# Patient Record
Sex: Male | Born: 1960 | Race: White | Hispanic: No | Smoking: Former smoker
Health system: Southern US, Community
[De-identification: ages and names within clinical notes are randomized; demographics above are authoritative.]

## PROBLEM LIST (undated history)

## (undated) DIAGNOSIS — I10 Essential (primary) hypertension: Secondary | ICD-10-CM

## (undated) DIAGNOSIS — G8929 Other chronic pain: Secondary | ICD-10-CM

## (undated) DIAGNOSIS — J15212 Pneumonia due to Methicillin resistant Staphylococcus aureus: Secondary | ICD-10-CM

## (undated) HISTORY — DX: Other chronic pain: G89.29

## (undated) HISTORY — PX: AV FISTULA PLACEMENT: SHX1204

## (undated) HISTORY — DX: Essential (primary) hypertension: I10

## (undated) HISTORY — PX: ORIF ANKLE FRACTURE BIMALLEOLAR: SUR920

## (undated) HISTORY — DX: Pneumonia due to methicillin resistant Staphylococcus aureus: J15.212

## (undated) HISTORY — PX: AV FISTULA REPAIR: SHX563

---

## 2010-07-07 ENCOUNTER — Ambulatory Visit: Payer: Self-pay | Admitting: Family Medicine

## 2010-07-07 ENCOUNTER — Encounter (INDEPENDENT_AMBULATORY_CARE_PROVIDER_SITE_OTHER): Payer: Self-pay | Admitting: *Deleted

## 2010-07-07 DIAGNOSIS — N186 End stage renal disease: Secondary | ICD-10-CM | POA: Insufficient documentation

## 2010-07-07 DIAGNOSIS — E11319 Type 2 diabetes mellitus with unspecified diabetic retinopathy without macular edema: Secondary | ICD-10-CM | POA: Insufficient documentation

## 2010-07-07 DIAGNOSIS — E118 Type 2 diabetes mellitus with unspecified complications: Secondary | ICD-10-CM

## 2010-07-07 DIAGNOSIS — M549 Dorsalgia, unspecified: Secondary | ICD-10-CM | POA: Insufficient documentation

## 2010-07-13 ENCOUNTER — Telehealth: Payer: Self-pay | Admitting: Family Medicine

## 2010-07-14 ENCOUNTER — Encounter: Payer: Self-pay | Admitting: Family Medicine

## 2010-07-14 ENCOUNTER — Telehealth: Payer: Self-pay | Admitting: Family Medicine

## 2010-07-16 ENCOUNTER — Encounter: Payer: Self-pay | Admitting: Family Medicine

## 2010-07-22 ENCOUNTER — Encounter: Payer: Self-pay | Admitting: Family Medicine

## 2010-07-30 ENCOUNTER — Ambulatory Visit: Payer: Self-pay | Admitting: Family Medicine

## 2010-08-04 ENCOUNTER — Encounter: Payer: Self-pay | Admitting: Family Medicine

## 2010-08-07 ENCOUNTER — Telehealth: Payer: Self-pay | Admitting: Family Medicine

## 2010-08-07 ENCOUNTER — Encounter: Payer: Self-pay | Admitting: Family Medicine

## 2010-08-11 ENCOUNTER — Telehealth: Payer: Self-pay | Admitting: Family Medicine

## 2010-09-01 ENCOUNTER — Ambulatory Visit: Payer: Self-pay | Admitting: Family Medicine

## 2010-09-04 ENCOUNTER — Telehealth: Payer: Self-pay | Admitting: Family Medicine

## 2010-09-07 ENCOUNTER — Telehealth: Payer: Self-pay | Admitting: Family Medicine

## 2010-09-07 ENCOUNTER — Encounter: Payer: Self-pay | Admitting: Family Medicine

## 2010-09-14 ENCOUNTER — Telehealth: Payer: Self-pay | Admitting: Family Medicine

## 2010-09-21 ENCOUNTER — Encounter: Payer: Self-pay | Admitting: Family Medicine

## 2010-10-08 ENCOUNTER — Encounter: Payer: Self-pay | Admitting: Family Medicine

## 2010-10-09 ENCOUNTER — Encounter: Payer: Self-pay | Admitting: Family Medicine

## 2010-10-13 ENCOUNTER — Ambulatory Visit: Payer: Self-pay | Admitting: Family Medicine

## 2010-10-14 ENCOUNTER — Encounter: Payer: Self-pay | Admitting: Family Medicine

## 2010-10-27 ENCOUNTER — Encounter: Payer: Self-pay | Admitting: Family Medicine

## 2010-11-16 ENCOUNTER — Encounter: Payer: Self-pay | Admitting: Family Medicine
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
## 2010-11-17 NOTE — Progress Notes (Signed)
  Phone Note Call from Patient   Caller: Patient Call For: Nani Gasser MD Summary of Call: pt wants refill of pain meds Initial call taken by: Avon Gully CMA, Duncan Dull),  September 14, 2010 4:43 PM    Prescriptions: MORPHINE SULFATE 15 MG TABS (MORPHINE SULFATE) one tablet by mouth two times a day  #60 x 0   Entered by:   Avon Gully CMA, (AAMA)   Authorized by:   Nani Gasser MD   Signed by:   Avon Gully CMA, (AAMA) on 09/14/2010   Method used:   Printed then faxed to ...       [address] [phone]* (retail)       [address] [place], [place]  [address]       Ph: [phone]       Fax: [phone]   RxID:   [id number]

## 2010-11-17 NOTE — Progress Notes (Signed)
Summary: PAIN REFERRAL  ---- Converted from flag ---- ---- 07/13/2010 2:31 PM, Michaelle Copas wrote: I spoke with [facility] at [facility] and she states that she will schedule this patient for a November appt... Thanks, Jen  ---- 07/13/2010 9:09 AM, Nani Gasser MD wrote: Can you find out when his appt is with Dr. Oneal Grout. I need to refill his pain meds but want to know how much to give him. ------------------------------

## 2010-11-17 NOTE — Progress Notes (Signed)
Summary: FYI  Phone Note From Other Clinic Call back at [phone]   Caller: Olegario Messier- [facility] Call For: [facility] Summary of Call: Order to evaluate for PT needs.  STates she did not pick him up for PT he is being seen for low vision program and states pt was not very co-operative with that. Initial call taken by: Kathlene November LPN,  September 04, 2010 8:14 AM

## 2010-11-17 NOTE — Progress Notes (Signed)
Summary: Med List Brought by Patient  Med List Brought by Patient   Imported By: Lanelle Bal 08/21/2010 12:27:55  _____________________________________________________________________  External Attachment:    Type:   Image     Comment:   External Document

## 2010-11-17 NOTE — Letter (Signed)
Summary: Medication Discharge Instructions/[facility]  Medication Discharge Instructions/[facility]   Imported By: Sherian Rein 08/18/2010 13:40:59  _____________________________________________________________________  External Attachment:    Type:   Image     Comment:   External Document

## 2010-11-17 NOTE — Letter (Signed)
Summary: [facility]   Imported By: Lanelle Bal 09/15/2010 12:48:04  _____________________________________________________________________  External Attachment:    Type:   Image     Comment:   External Document

## 2010-11-17 NOTE — Progress Notes (Signed)
Summary: Pain management  Phone Note Call from Patient   Caller: Sister- [place] Call For: [facility] Summary of Call: Pt sisiter calls and would like to speak with you in regards to her brother= states its kind of important. Please call her on cell at [phone] Initial call taken by: Kathlene November LPN,  September 07, 2010 12:41 PM  Follow-up for Phone Call        Let him know I will fill meds until pain managment has a change to get his records and see him back.  Follow-up by: Nani Gasser MD,  September 08, 2010 1:13 PM  Additional Follow-up for Phone Call Additional follow up Details #1::        left message on [place]'s vm with abv(sister) Additional Follow-up by: Avon Gully CMA, Duncan Dull),  September 08, 2010 4:30 PM

## 2010-11-17 NOTE — Progress Notes (Signed)
Summary: Rescheduled pain MD appt.  Phone Note Call from Patient Call back at Home Phone [phone]   Caller: Patient Call For: Nani Gasser MD Summary of Call: Pt had to change his pain MD appt. due to fistula appt. Will be seen on November22, 2011 Initial call taken by: Kathlene November LPN,  August 11, 2010 10:04 AM

## 2010-11-17 NOTE — Progress Notes (Signed)
  Phone Note Call from Patient   Caller: sister Call For: Nani Gasser MD Summary of Call: [place] pt's sister called to let you know that pt's morohine sulfate is due on tues and wants to pick it up monday Initial call taken by: Avon Gully CMA, Duncan Dull),  August 07, 2010 4:06 PM    Prescriptions: MORPHINE SULFATE 15 MG TABS (MORPHINE SULFATE) one tablet by mouth two times a day  #60 x 0   Entered by:   Avon Gully CMA, (AAMA)   Authorized by:   Nani Gasser MD   Signed by:   Avon Gully CMA, (AAMA) on 08/07/2010   Method used:   Print then Give to Patient   RxID:   [id number]

## 2010-11-17 NOTE — Progress Notes (Signed)
Summary: Morphine Rx  Phone Note Call from Patient Call back at [phone]   Caller: Sister- Ilona Sorrel Call For: Nani Gasser MD Summary of Call: Pt sister called and said she tore up his rx for the Morphine Sulfate that you gave him. Said she guesses it was with all receipts and when she tore them up it was in there with them- feels bad about it and wanted to know if you would give him another one Initial call taken by: Kathlene November,  July 13, 2010 8:50 AM  Follow-up for Phone Call        Will refill once.   Follow-up by: Nani Gasser MD,  July 13, 2010 2:25 PM  Additional Follow-up for Phone Call Additional follow up Details #1::        Pt notified Additional Follow-up by: Kathlene November,  July 13, 2010 2:35 PM    Prescriptions: MORPHINE SULFATE 15 MG TABS (MORPHINE SULFATE) one tablet by mouth two times a day  #60 x 0   Entered and Authorized by:   Nani Gasser MD   Signed by:   Nani Gasser MD on 07/13/2010   Method used:   Print then Give to Patient   RxID:   [id number]

## 2010-11-17 NOTE — Letter (Signed)
[facility]   Imported By: Lanelle Bal 08/13/2010 11:22:24  _____________________________________________________________________  External Attachment:    Type:   Image     Comment:   External Document
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
## 2010-11-17 NOTE — Assessment & Plan Note (Signed)
Summary: 1 mo f/u HTN, DM   Vital Signs:  Patient profile:   50 year old male Weight:      255 pounds Pulse rate:   90 / minute BP sitting:   83 / 44  (left arm) Cuff size:   regular  Vitals Entered By: Avon Gully CMA, Duncan Dull) (September 01, 2010 1:43 PM) CC: f/u BP   CC:  f/u BP.  History of Present Illness: BPs are still running low even after stopping several BP meds. His sister sasys he will often skip his atenolol the night before dialysis.    He is seeing pain managment in a couple of weeks for his hands.  Hx of nerve damage. Previously on neurontin but they felt his is was caused his acute renal failure per his sister. He feel his hands drawing up at times and they are painful.    Current Medications (verified): 1)  Atenolol 50 Mg Tabs (Atenolol) .... One-Half  Tablet By Mouth Two Times A Day 2)  Stool Softener 100 Mg Caps (Docusate Sodium) .... On E Tablet By Mouth Three Times A Day 3)  Bumetanide 2 Mg Tabs (Bumetanide) .... Two Tablets By Moutj  Two Times A Day 4)  Morphine Sulfate 15 Mg Tabs (Morphine Sulfate) .... One Tablet By Mouth Two Times A Day 5)  Promethazine Hcl 25 Mg Tabs (Promethazine Hcl) .... One Tablet By Mouth Every 4 To 6 Hours As Needed 6)  Omeprazole 40 Mg Cpdr (Omeprazole) .... Take One Tablet Daily 7)  Doxazosin Mesylate 4 Mg Tabs (Doxazosin Mesylate) .... Take One Tablet By Mouth Once A Day 8)  Tramadol Hcl 50 Mg Tabs (Tramadol Hcl) .... One Tablet By Mouth  Every  8 Hours As Needed For Pain 9)  Levemir 100 Unit/ml Soln (Insulin Detemir) .... 44 Units  At Night 10)  Novolin R 100 Unit/ml Soln (Insulin Regular Human) .Marland Kitchen.. 12 Units Before Meals 11)  Simvastatin 40 Mg Tabs (Simvastatin) .... Take 1 Tablet By Mouth Once A Day At Bedtime  Allergies (verified): No Known Drug Allergies  Comments:  Nurse/Medical Assistant: The patient's medications and allergies were reviewed with the patient and were updated in the Medication and Allergy  Lists. Avon Gully CMA, Duncan Dull) (September 01, 2010 1:43 PM)  Physical Exam  General:  Well-developed,well-nourished,in no acute distress; alert,appropriate and cooperative throughout examination Head:  Normocephalic and atraumatic without obvious abnormalities. No apparent alopecia or balding. Lungs:  Normal respiratory effort, chest expands symmetrically. Lungs are clear to auscultation, no crackles or wheezes. Heart:  Normal rate and regular rhythm. S1 and S2 normal without gallop, murmur, click, rub or other extra sounds. Extremities:  NO LE edema.  Skin:  Surgical scar on his right forearm, with steri-stips still in place.    Impression & Recommendations:  Problem # 1:  HYPERTENSION, BENIGN (ICD-401.1) Stop the atenolol and ded the bumetanide to one two times a day  F/U in 6-8 weeks.   The following medications were removed from the medication list:    Atenolol 50 Mg Tabs (Atenolol) ..... One-half  tablet by mouth two times a day His updated medication list for this problem includes:    Bumetanide 2 Mg Tabs (Bumetanide) .Marland Kitchen... Take 1 tablet by mouth two times a day    Doxazosin Mesylate 4 Mg Tabs (Doxazosin mesylate) .Marland Kitchen... Take one tablet by mouth once a day  Problem # 2:  DIABETES MELLITUS (ICD-250.00) Given samples todya. He is having a hard time affording his insulin meds.  His updated medication list for this problem includes:    Levemir 100 Unit/ml Soln (Insulin detemir) .Marland KitchenMarland KitchenMarland KitchenMarland Kitchen 44 units [place] at night    Novolin R 100 Unit/ml Soln (Insulin regular human) .Marland Kitchen... 12 units before meals  Problem # 3:  BACK PAIN, CHRONIC (ICD-724.5) I did give him small quanitity of oxycodone until he sees pain managment.   His updated medication list for this problem includes:    Morphine Sulfate 15 Mg Tabs (Morphine sulfate) ..... One tablet by mouth two times a day    Tramadol Hcl 50 Mg Tabs (Tramadol hcl) ..... One tablet by mouth  every  8 hours as needed for pain    Oxycodone-acetaminophen  5-325 Mg Tabs (Oxycodone-acetaminophen) .Marland Kitchen... Take 1 tablet by mouth three times a day as needed for severe pain.  Complete Medication List: 1)  Stool Softener 100 Mg Caps (Docusate sodium) .... On e tablet by mouth three times a day 2)  Bumetanide 2 Mg Tabs (Bumetanide) .... Take 1 tablet by mouth two times a day 3)  Morphine Sulfate 15 Mg Tabs (Morphine sulfate) .... One tablet by mouth two times a day 4)  Promethazine Hcl 25 Mg Tabs (Promethazine hcl) .... One tablet by mouth every 4 to 6 hours as needed 5)  Omeprazole 40 Mg Cpdr (Omeprazole) .... Take one tablet daily 6)  Doxazosin Mesylate 4 Mg Tabs (Doxazosin mesylate) .... Take one tablet by mouth once a day 7)  Tramadol Hcl 50 Mg Tabs (Tramadol hcl) .... One tablet by mouth  every  8 hours as needed for pain 8)  Levemir 100 Unit/ml Soln (Insulin detemir) .... 44 units  at night 9)  Novolin R 100 Unit/ml Soln (Insulin regular human) .Marland Kitchen.. 12 units before meals 10)  Simvastatin 40 Mg Tabs (Simvastatin) .... Take 1 tablet by mouth once a day at bedtime 11)  Calcium Acetate 667 Mg Caps (Calcium acetate (phos binder)) .... 2 tabs daily. 12)  Oxycodone-acetaminophen 5-325 Mg Tabs (Oxycodone-acetaminophen) .... Take 1 tablet by mouth three times a day as needed for severe pain.  Patient Instructions: 1)  Stop the atenolol and cut the bumetanide in half.  2)  Please schedule a follow-up appointment end of December for blood pressure and diabetes.  Prescriptions: OXYCODONE-ACETAMINOPHEN 5-325 MG TABS (OXYCODONE-ACETAMINOPHEN) Take 1 tablet by mouth three times a day as needed for severe pain.  #30 x 0   Entered and Authorized by:   Nani Gasser MD   Signed by:   Nani Gasser MD on 09/01/2010   Method used:   Printed then faxed to ...       [address] [phone]* (retail)       [address] [place], [place]  [address]       Ph: [phone]       Fax: [phone]   RxID:   [id number] BUMETANIDE 2 MG TABS  (BUMETANIDE) Take 1 tablet by mouth two times a day  #60 x 2   Entered and Authorized by:   Nani Gasser MD   Signed by:   Nani Gasser MD on 09/01/2010   Method used:   Electronically to        [facility] [phone]* (retail)       [address] [place], [place]  [address]       Ph: [phone]       Fax: [phone]   RxID:   [id number]  Orders Added: 1)  Est. Patient Level III [818[address]]

## 2010-11-17 NOTE — Letter (Signed)
Summary: Primary Care Consult Scheduled Letter  [facility]  [address], [address]   [place], [place] [address]   Phone: [phone]  Fax: [phone]      07/07/2010 MRN: [id number]  Arthur Frost [address] [employer], [place]  [address]    Dear Arthur Frost,   We have scheduled an appointment for you.  At the recommendation of Dr.Metheney, we have scheduled you a consult with [facility].Reese on Tuesday 07/14/10 at 11:00.  Their address is [address] [place], [place] [address]. The office phone number is [phone].  If this appointment day and time is not convenient for you, please feel free to call the office of the doctor you are being referred to at the number listed above and reschedule the appointment.     It is important for you to keep your scheduled appointments. We are here to make sure you are given good patient care.    Thank you, Michaelle Copas [phone] Patient Care Coordinator [facility] Kathryne Sharper

## 2010-11-17 NOTE — Miscellaneous (Signed)
Summary: [facility]  [facility]   Imported By: Lanelle Bal 09/23/2010 15:17:51  _____________________________________________________________________  External Attachment:    Type:   Image     Comment:   External Document

## 2010-11-17 NOTE — Consult Note (Signed)
Summary: Gaye Pollack MD  Gaye Pollack MD   Imported By: Lanelle Bal 08/05/2010 12:55:24  _____________________________________________________________________  External Attachment:    Type:   Image     Comment:   External Document

## 2010-11-17 NOTE — Progress Notes (Signed)
Summary: FYI- Pain clinic  Phone Note From Other Clinic   Caller: [facility] Call For: [facility] Summary of Call: Call from pain clinic stating they seen patient in their office today for first consultation and patient stated he has an extensive drug history of cocaine, crack and marijuuana and acid use as early last use was January. MD said she is not [place] write any pain meds for this patient and will await records that they requested for review as to if they will see this patient back or not.  Just an FYI Initial call taken by: Kathlene November LPN,  September 07, 2010 12:33 PM

## 2010-11-17 NOTE — Consult Note (Signed)
Summary: [facility]  [facility]   Imported By: Lanelle Bal 07/31/2010 09:22:01  _____________________________________________________________________  External Attachment:    Type:   Image     Comment:   External Document

## 2010-11-19 ENCOUNTER — Encounter: Payer: Self-pay | Admitting: Family Medicine

## 2010-11-19 NOTE — Letter (Signed)
[facility]   Imported By: Lanelle Bal 10/26/2010 12:09:01  _____________________________________________________________________  External Attachment:    Type:   Image     Comment:   External Document
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
## 2010-11-19 NOTE — Miscellaneous (Signed)
  Clinical Lists Changes  Medications: Changed medication from MORPHINE SULFATE 15 MG TABS (MORPHINE SULFATE) one tablet by mouth two times a day to * MORPHINE SULFATE IR 15 MG TABS (MORPHINE SULFATE) one tablet by mouth two times a day

## 2010-11-19 NOTE — Miscellaneous (Signed)
Summary: Missed Visit/[facility]  Missed Visit/[facility]   Imported By: Lanelle Bal 10/02/2010 11:36:07  _____________________________________________________________________  External Attachment:    Type:   Image     Comment:   External Document

## 2010-11-19 NOTE — Consult Note (Signed)
Summary: [facility]   Imported By: Lanelle Bal 11/12/2010 10:59:12  _____________________________________________________________________  External Attachment:    Type:   Image     Comment:   External Document

## 2010-11-19 NOTE — Miscellaneous (Signed)
Summary: Episode Summary/[facility]  Episode Summary/[facility]   Imported By: Lanelle Bal 10/28/2010 08:46:09  _____________________________________________________________________  External Attachment:    Type:   Image     Comment:   External Document

## 2010-11-20 ENCOUNTER — Encounter: Payer: Self-pay | Admitting: Family Medicine

## 2010-11-24 ENCOUNTER — Encounter: Payer: Self-pay | Admitting: Family Medicine

## 2010-11-26 ENCOUNTER — Telehealth: Payer: Self-pay | Admitting: Family Medicine

## 2010-12-03 ENCOUNTER — Telehealth: Payer: Self-pay | Admitting: Family Medicine

## 2010-12-03 NOTE — Miscellaneous (Signed)
Summary: Certification and Plan of Care/[facility]  Certification and Plan of Care/[facility]   Imported By: Maryln Gottron 11/26/2010 12:33:14  _____________________________________________________________________  External Attachment:    Type:   Image     Comment:   External Document

## 2010-12-03 NOTE — Progress Notes (Signed)
Summary: High Bld sugar   Phone Note Call from Patient   Caller: Other Relative Call For: Dr. Cathey Endow Summary of Call: Pt's sister called and states Pt's sugar is 327 just now. Pt takes 12 units of insulin and his last dose was last pm. Please advise Initial call taken by: Avon Gully CMA, Duncan Dull),  November 26, 2010 9:55 AM  Follow-up for Phone Call        Is he taking Levemir 44 units at night also?  What did he eat this AM? has he recently been on Prednisone or had chest pain or fevers? Follow-up by: Seymour Bars DO,  November 26, 2010 9:59 AM  Additional Follow-up for Phone Call Additional follow up Details #1::        Pt's sister thinks he had a steroid injection in his spine at the pain clinic on mon. He is taking levemir 44 at night Additional Follow-up by: Avon Gully CMA, Duncan Dull),  November 26, 2010 10:09 AM    Additional Follow-up for Phone Call Additional follow up Details #2::    Continue current dose of Levemir.  Inject 7 units of Novolin R now.  Recheck sugar after 30 min. OK to resume 12 units with lunch today; recheck sugar 2 hrs after lunch and call.  Eat low sugar/ low carb and drink plenty of water. Steroid injection is likely to increase sugars for about a week. Follow-up by: Seymour Bars DO,  November 26, 2010 10:36 AM   Appended Document: High Bld sugar  11/26/10 10:58 acm  pt's sister was notified of dr. Misty Stanley instructions and will call back with readings.

## 2010-12-03 NOTE — Miscellaneous (Signed)
Summary: Discharge Instructions/[facility]  Discharge Instructions/[facility]   Imported By: Maryln Gottron 11/25/2010 11:26:32  _____________________________________________________________________  External Attachment:    Type:   Image     Comment:   External Document

## 2010-12-08 ENCOUNTER — Encounter: Payer: Self-pay | Admitting: Family Medicine

## 2010-12-09 NOTE — Progress Notes (Signed)
  Phone Note Refill Request Message from:  Pharmacy on December 03, 2010 3:05 PM  Refills Requested: Medication #1:  LEVEMIR 100 UNIT/ML SOLN 44 units [place] at night Initial call taken by: Avon Gully CMA, Duncan Dull),  December 03, 2010 3:05 PM    Prescriptions: LEVEMIR 100 UNIT/ML SOLN (INSULIN DETEMIR) 44 units [place] at night  #90 day sup x 3   Entered by:   Avon Gully CMA, (AAMA)   Authorized by:   Nani Gasser MD   Signed by:   Avon Gully CMA, (AAMA) on 12/03/2010   Method used:   Telephoned to ...       [address] [phone]* (retail)       [address] [place], [place]  [address]       Ph: [phone]       Fax: [phone]   RxID:   [id number]

## 2010-12-15 NOTE — Letter (Signed)
Summary: [facility]   Imported By: Maryln Gottron 12/07/2010 15:05:09  _____________________________________________________________________  External Attachment:    Type:   Image     Comment:   External Document

## 2010-12-15 NOTE — Letter (Signed)
Summary: [facility]   Imported By: Maryln Gottron 12/07/2010 13:54:55  _____________________________________________________________________  External Attachment:    Type:   Image     Comment:   External Document

## 2010-12-17 ENCOUNTER — Encounter: Payer: Self-pay | Admitting: Family Medicine

## 2010-12-24 NOTE — Letter (Addendum)
Summary: [facility]  [facility]   Imported By: Lanelle Bal 12/18/2010 09:29:25  _____________________________________________________________________  External Attachment:    Type:   Image     Comment:   External Document

## 2011-01-12 ENCOUNTER — Ambulatory Visit (INDEPENDENT_AMBULATORY_CARE_PROVIDER_SITE_OTHER): Payer: Medicare Other | Admitting: Family Medicine

## 2011-01-12 DIAGNOSIS — E119 Type 2 diabetes mellitus without complications: Secondary | ICD-10-CM

## 2011-01-12 DIAGNOSIS — E785 Hyperlipidemia, unspecified: Secondary | ICD-10-CM

## 2011-01-12 MED ORDER — INSULIN REGULAR HUMAN 100 UNIT/ML IJ SOLN: 12.0000 [IU] | Freq: Three times a day (TID) | INTRAMUSCULAR | Status: DC

## 2011-01-12 MED ORDER — DOXAZOSIN MESYLATE 4 MG PO TABS: 4.0000 mg | ORAL_TABLET | Freq: Every day | ORAL | Status: DC

## 2011-01-12 MED ORDER — SIMVASTATIN 40 MG PO TABS: 40.0000 mg | ORAL_TABLET | Freq: Every day | ORAL | Status: DC

## 2011-01-12 NOTE — Patient Instructions (Addendum)
Consider amitryptiline at bedtime for your headaches.  If sugar before a meal is 150-180 give 2 extra units, if 180-210 give 3 extra units, 210-240 give 4 extra units.

## 2011-01-12 NOTE — Assessment & Plan Note (Signed)
Not well controlled compared to last time.  Discussed will add a sliding scale to his mealtime insulin. Right now he is using 12 units with each meal and his long acting insulin.  F/U in one month. Also discussed he needs to pay attention to meals and what may be raising his sugar. If am sugar is high then eval the evening meal as this is likely the cause of the high sugar. Pt says he understands and will start to track this.

## 2011-01-12 NOTE — Progress Notes (Signed)
  Subjective:    Patient ID: Arthur Frost, male    DOB: Aug 13, 1961, 50 y.o.   MRN: [id number]  Diabetes He presents for his follow-up diabetic visit. He has type 2 diabetes mellitus. His disease course has been stable. There are no hypoglycemic associated symptoms. There are no hypoglycemic complications. Diabetic complications include nephropathy and retinopathy. Current diabetic treatment includes intensive insulin program. His home blood glucose trend is fluctuating dramatically. (115-221 AM, fasting. ) Eye exam is current.   No significant about swelling. He is getting dialysis 3 x a week. He had his permacath removed last week.    Review of Systems     Objective:   Physical Exam  Constitutional: He appears well-developed and well-nourished.       obese  HENT:  Head: Normocephalic and atraumatic.  Cardiovascular: Regular rhythm and normal heart sounds.   Pulmonary/Chest: Effort normal and breath sounds normal.          Assessment & Plan:

## 2011-01-15 ENCOUNTER — Telehealth: Payer: Self-pay | Admitting: Family Medicine

## 2011-01-15 LAB — LIPID PANEL
Cholesterol: 165 mg/dL (ref 0–200)
LDL Cholesterol: 68 mg/dL (ref 0–99)
Triglycerides: 233 mg/dL — ABNORMAL HIGH (ref <150)

## 2011-01-15 NOTE — Telephone Encounter (Signed)
Call pt: LDL looks great. TG are still high. Start fish oil 3 tabs dialy.

## 2011-01-17 ENCOUNTER — Encounter: Payer: Self-pay | Admitting: Family Medicine

## 2011-01-18 ENCOUNTER — Telehealth: Payer: Self-pay | Admitting: Family Medicine

## 2011-01-18 NOTE — Telephone Encounter (Signed)
Pt's brother was notified of results.

## 2011-01-18 NOTE — Telephone Encounter (Signed)
Call pt: LDL looks great. TG are still a little high. Inc fish oil to 4 a day. Can take with any meal or can separate doses.  Also work on low fat foods.  Recheck TG in 8 weeks.

## 2011-01-19 ENCOUNTER — Ambulatory Visit (INDEPENDENT_AMBULATORY_CARE_PROVIDER_SITE_OTHER): Payer: Medicare Other | Admitting: Family Medicine

## 2011-01-19 VITALS — BP 147/64 | HR 74 | Ht 69.0 in | Wt 272.0 lb

## 2011-01-19 DIAGNOSIS — M62838 Other muscle spasm: Secondary | ICD-10-CM

## 2011-01-19 DIAGNOSIS — Z1211 Encounter for screening for malignant neoplasm of colon: Secondary | ICD-10-CM

## 2011-01-19 DIAGNOSIS — M62449 Contracture of muscle, unspecified hand: Secondary | ICD-10-CM

## 2011-01-19 MED ORDER — INSULIN ASPART 100 UNIT/ML ~~LOC~~ SOLN: 12.0000 [IU] | Freq: Three times a day (TID) | SUBCUTANEOUS | Status: DC

## 2011-01-19 NOTE — Telephone Encounter (Signed)
Pt has already been notified.

## 2011-01-19 NOTE — Progress Notes (Signed)
Subjective:    Patient ID: Arthur Frost, male    DOB: [birth year], 50 y.o.   MRN: [id number]  HPI Has been using a wheelchair for about 7-8 months for severe back pain and knee and hip pain.  Knee and hip would give out and he would fall. He has had a repair of the meniscus on the right knee.  No surgery on his lower back.  Last MRI showed some OA of his back.  He has made some accommodations in his home. He currently uses a manual wheelchair. Most of the time one of his family members pushes him around. He does not have the strength in his legs or knees to push himself. He also has some contractures in his hands which makes it difficult for him to pushes on wheelchair as well. He can push up to standing as long as he holds onto something. He cannot take a step without some type of assistance or something to brace himself against. He is able to prepare some food for himself. Most of the time his brother does the cooking and he is limited in his capabilities. Has a walk-in shower.  Uses bars to transfer to the toilet. Unable to transfer without full assistance. Can stand for 5 minutes at the most with some support or assistance. He has significant balance issues for which he sees some type of catheter support to lean against it he is going to stand. He has had a number of falls starting since last April 20 11. That was around the time he was hospitalized for renal failure and I think this led to increased lower extremity and a portion of the weakness.  Review of Systems  BP 147/64  Pulse 74  Ht 5\' 9"  (1.753 m)  Wt 272 lb (123.378 kg)  BMI 40.17 kg/m2    No Known Allergies  Past Medical History  Diagnosis Date  . Diabetic retinopathy     legally blind-see Dr. Gaye Pollack  . MRSA (methicillin resistant staphylococcus aureus) pneumonia   . Hypertension     now normal since on dialysis  . Chronic pain     pain meds are for his back    Past Surgical History  Procedure Date  . Av fistula  repair   . Av fistula placement     History   Social History  . Marital Status: Widowed    Spouse Name: N/A    Number of Children: N/A  . Years of Education: N/A   Occupational History  . Not on file.   Social History Main Topics  . Smoking status: Former Games developer  . Smokeless tobacco: Not on file  . Alcohol Use: No  . Drug Use: No  . Sexually Active:    Other Topics Concern  . Not on file   Social History Narrative  . No narrative on file    Family History  Problem Relation Age of Onset  . Alcohol abuse      Family hx of  . Diabetes      Family hx of  . Hyperlipidemia      Family hx of  . Hypertension      Family hx of    Current outpatient prescriptions:bumetanide (BUMEX) 2 MG tablet, Take 2 mg by mouth 2 (two) times daily.  , Disp: , Rfl: ;  calcium acetate (PHOSLO) 667 MG capsule, 667 mg. 2 tablets daily , Disp: , Rfl: ;  docusate sodium (COLACE) 100 MG capsule, Take 100 mg  by mouth 3 (three) times daily as needed.  , Disp: , Rfl: ;  doxazosin (CARDURA) 4 MG tablet, Take 1 tablet (4 mg total) by mouth daily., Disp: 90 tablet, Rfl: 1 fentaNYL (DURAGESIC - DOSED MCG/HR) 75 MCG/HR, , Disp: , Rfl: ;  HYDROcodone-acetaminophen (NORCO) 10-325 MG per tablet, Take 1 tablet by mouth 3 (three) times daily as needed.  , Disp: , Rfl: ;  insulin detemir (LEVEMIR) 100 UNIT/ML injection, Inject into the skin at bedtime.  , Disp: , Rfl: ;  morphine (MSIR) 15 MG tablet, Take 15 mg by mouth 2 (two) times daily.  , Disp: , Rfl:  Omega-3 Fatty Acids (FISH OIL) 1000 MG CAPS, Take 4 capsules (4,000 mg total) by mouth daily., Disp: , Rfl: 0;  omeprazole (PRILOSEC) 40 MG capsule, Take 40 mg by mouth daily.  , Disp: , Rfl: ;  oxyCODONE-acetaminophen (PERCOCET) 5-325 MG per tablet, Take 1 tablet by mouth every 4 (four) hours as needed.  , Disp: , Rfl: ;  promethazine (PHENERGAN) 25 MG tablet, Take 25 mg by mouth every 6 (six) hours as needed.  , Disp: , Rfl:  simvastatin (ZOCOR) 40 MG tablet,  Take 1 tablet (40 mg total) by mouth at bedtime., Disp: 90 tablet, Rfl: 2;  tobramycin-dexamethasone (TOBRADEX) ophthalmic solution, , Disp: , Rfl: ;  traMADol (ULTRAM) 50 MG tablet, Take 50 mg by mouth every 6 (six) hours as needed.  , Disp: , Rfl: ;  VIGAMOX 0.5 % ophthalmic solution, , Disp: , Rfl:  insulin aspart (NOVOLOG) 100 UNIT/ML injection, Inject 12-15 Units into the skin 3 (three) times daily before meals., Disp: 10 mL, Rfl: 3     Objective:   Physical Exam  Constitutional: He appears well-developed and well-nourished.       Obese  HENT:  Head: Normocephalic and atraumatic.  Eyes:       Sclerae are significantly red and irritated in both eyes is is wearing sunglasses today  Cardiovascular: Normal rate, regular rhythm and normal heart sounds.   Pulmonary/Chest: Effort normal and breath sounds normal.  Musculoskeletal:       Her extremities he has decreased range of motion. He is able to extend to about 110 with significant straining. He has very limited internal rotation of the shoulders he is just able to touch the sides of his low back. Strength in his shoulders is 5 over 5 strength in his hands is 4/5 and he has significant contractures in both hands. He also has mild lower body weakness as well. Hip and knee strength is 5 over 5 bilaterally. He is unable to dorsiflex his right foot. He is able to flex normally with decreased strength. His left foot has normal range of motion and strength. No lower extremity edema  Neurological:       He had to use both arms a wheelchair to push up to a standing position with several attempts to push off. He was unable to take one step without assistance to 2 week torso.          Assessment & Plan:  Wheelchair assessment I do think you have a great candidate for an electric wheelchair. I think he would have significant difficulty with a scooter because of his poor trunk stability and balance. He is unable to walk more than 1 foot without some  significant assistance. He also has significant contractures of his hands but should be able to use a joystick. A cane or walker it is not significant enough  to meet his needs, nor is a manual wheelchair. He also has upper and lower extremity weakness. Patient says he is willing and motivated to use a electric wheelchair. Based on this face-to-face encounter I do think he has significant functional limitations that supports the need for an electric wheelchair to be used in the home. Will complete forms.  30 min spent face to face in evaluation.

## 2011-01-20 DIAGNOSIS — M62449 Contracture of muscle, unspecified hand: Secondary | ICD-10-CM | POA: Insufficient documentation

## 2011-02-02 ENCOUNTER — Encounter: Payer: Self-pay | Admitting: Family Medicine

## 2011-02-11 ENCOUNTER — Encounter: Payer: Self-pay | Admitting: Family Medicine

## 2011-02-11 ENCOUNTER — Ambulatory Visit (INDEPENDENT_AMBULATORY_CARE_PROVIDER_SITE_OTHER): Payer: Medicare Other | Admitting: Family Medicine

## 2011-02-11 DIAGNOSIS — E119 Type 2 diabetes mellitus without complications: Secondary | ICD-10-CM

## 2011-02-11 DIAGNOSIS — R51 Headache: Secondary | ICD-10-CM

## 2011-02-11 NOTE — Progress Notes (Signed)
  Subjective:    Patient ID: Arthur Frost, male    DOB: 11/11/1960, 50 y.o.   MRN: [id number]  HPI DM-here to followup his diabetes. He was seen 11 months ago for this. He was not well-controlled so we discussed adding mealtime insulin. He says he is doing well with this. Primarly using 12 units with each meals. No lows. Most fasting sugars around 100.  Novolog was not bting covered by insurance. This will need to change to another short-acting insulin.  Severe HA from his right eye.  HA are debilitating.  They started atropine drops to try to help, yesterday. Today he still has a severe headache. He follows up with pain management on Tuesday of next week.   Review of Systems     Objective:   Physical Exam  Constitutional: He appears well-developed and well-nourished.       He is sitting holding his head tenderness the office visit today.  HENT:  Head: Normocephalic and atraumatic.  Eyes:       The sclera on the right eye are severely injected and he has pretty significant eyelid edema.          Assessment & Plan:  Severe headache from his right eye-I discussed with him that he really needs to have his eye doctor talk with his pain management doctor. I think he does need more significant pain control. Note I also discussed that we can consider medications that we do use for migraines. Patient does have a history of migraines. We certainly could consider something like amitriptyline, Neurontin, or even Topamax. He can discuss this with his eye doctor and Dr. Wende Neighbors he is well to see what they think.

## 2011-02-11 NOTE — Assessment & Plan Note (Signed)
Based on his home sugars he is at goal. He can followup in 2 months for his next A1c. If he starts having lows please call her office. He is very compliant and eats very regularly which makes it easier to control his sugars. He is unable to get any significant exercise.

## 2011-02-11 NOTE — Patient Instructions (Addendum)
Consider trial of topamax or amitryptiline.or maybe even neurontin. I also recommend to have your eye doctor to speak with Dr. Jordan Likes as well.  Continue your insulin 12 units with each meal

## 2011-02-23 ENCOUNTER — Ambulatory Visit
Admission: RE | Admit: 2011-02-23 | Discharge: 2011-02-23 | Disposition: A | Discharge status: Home / Self Care | Payer: Medicare Other | Source: Ambulatory Visit | Attending: Pain Medicine | Admitting: Pain Medicine

## 2011-02-23 ENCOUNTER — Other Ambulatory Visit: Payer: Self-pay | Admitting: Pain Medicine

## 2011-02-23 DIAGNOSIS — M25569 Pain in unspecified knee: Secondary | ICD-10-CM

## 2011-03-10 ENCOUNTER — Telehealth: Payer: Self-pay | Admitting: Family Medicine

## 2011-03-10 NOTE — Telephone Encounter (Signed)
Pts sister called and stated pt complaining of SOB associated with Chest pain throughout the night.  Sister inquiring what to do.  She is a Charity fundraiser and said pt wants to take a bath before going anywhere. Plan:  Told pt sister that pt needs to go straight to the Ed and not bother taking a shower beforehand.   Jarvis Newcomer, LPN [facility]

## 2011-03-26 ENCOUNTER — Other Ambulatory Visit: Payer: Self-pay | Admitting: Family Medicine

## 2011-03-26 MED ORDER — INSULIN ASPART 100 UNIT/ML ~~LOC~~ SOLN: 12.0000 [IU] | Freq: Three times a day (TID) | SUBCUTANEOUS | Status: DC

## 2011-04-15 ENCOUNTER — Ambulatory Visit: Payer: PRIVATE HEALTH INSURANCE | Admitting: Family Medicine

## 2011-07-14 ENCOUNTER — Telehealth: Payer: Self-pay | Admitting: *Deleted

## 2011-07-14 NOTE — Telephone Encounter (Signed)
meds updated.

## 2011-07-27 ENCOUNTER — Encounter: Payer: Self-pay | Admitting: Family Medicine

## 2011-07-27 ENCOUNTER — Ambulatory Visit (INDEPENDENT_AMBULATORY_CARE_PROVIDER_SITE_OTHER): Payer: Medicare Other | Admitting: Family Medicine

## 2011-07-27 VITALS — BP 134/75 | HR 75 | Wt 261.0 lb

## 2011-07-27 DIAGNOSIS — J3489 Other specified disorders of nose and nasal sinuses: Secondary | ICD-10-CM

## 2011-07-27 DIAGNOSIS — E119 Type 2 diabetes mellitus without complications: Secondary | ICD-10-CM

## 2011-07-27 NOTE — Progress Notes (Signed)
  Subjective:    Patient ID: Arthur Frost, male    DOB: [birth year], 50 y.o.   MRN: [id number]  Diabetes He presents for his follow-up diabetic visit. He has type 2 diabetes mellitus. His disease course has been stable. Pertinent negatives for hypoglycemia include no headaches. Pertinent negatives for diabetes include no chest pain, no polydipsia and no polyuria. There are no hypoglycemic complications. Symptoms are stable. Diabetic complications include nephropathy. Risk factors for coronary artery disease include male sex, obesity and sedentary lifestyle. Current diabetic treatment includes diet. He is compliant with treatment all of the time. He is following a diabetic diet. When asked about meal planning, he reported none. He has not had a previous visit with a dietician. He rarely participates in exercise. His breakfast blood glucose range is generally 140-180 mg/dl. An ACE inhibitor/angiotensin II receptor blocker is contraindicated. Eye exam is current.    GEtting nasal congestion at night.  No fever or ear pain. Started a couple a weeks ago. No other cold symptoms.  Review of Systems  Cardiovascular: Negative for chest pain.  Genitourinary: Negative for polyuria.  Neurological: Negative for headaches.  Hematological: Negative for polydipsia.       Objective:   Physical Exam  Constitutional: He is oriented to person, place, and time. He appears well-developed and well-nourished.  HENT:  Head: Normocephalic and atraumatic.  Eyes:       Sclera on the right eye is injected. The left eye appears more normal. Oropharynx is clear TMs and canals bilaterally are clear. No significant turbinate edema of the nose.  Neck: Neck supple. No thyromegaly present.  Cardiovascular: Normal rate, regular rhythm and normal heart sounds.   Pulmonary/Chest: Effort normal and breath sounds normal.  Lymphadenopathy:    He has no cervical adenopathy.  Neurological: He is alert and oriented to person, place,  and time.  Skin: Skin is warm and dry.  Psychiatric: He has a normal mood and affect. His behavior is normal.          Assessment & Plan:  Diabetes-excellent control. His A1c is much better this time. He denies having any lows will continue his current regimen. Followup in 4 months. He has not had his flu vaccine. He does not need a urine albumin if he is tardy on dialysis. He is also not a candidate for ACE or an arm.  Nasal congestion-  likely rhinitis. Consider allergic rhinitis. I gave him a sample of a nasal steroid spray to try for 2 weeks he states improves his symptoms. I recommend using about one hour before bedtime. He can also consider getting a dust mite cover for his pillow case and couldn't spell it in the dryer on hot heat for 20-30 minutes to see if this helps as well. They have not recently changed Mr. laundry detergents said this is less likely.  I did ask him to check at dialysis to see if he has had an up-to-date tdap.

## 2011-07-27 NOTE — Patient Instructions (Signed)
Can use the spray - one spray in each nostril once a day at bedtime. Also consider dust mite covers.

## 2011-07-27 NOTE — Progress Notes (Signed)
Addended by: Avon Gully C on: 07/27/2011 04:10 PM   Modules accepted: Orders

## 2011-08-08 ENCOUNTER — Other Ambulatory Visit: Payer: Self-pay | Admitting: Family Medicine

## 2011-10-06 ENCOUNTER — Encounter: Payer: Self-pay | Admitting: Family Medicine

## 2011-10-07 ENCOUNTER — Ambulatory Visit (INDEPENDENT_AMBULATORY_CARE_PROVIDER_SITE_OTHER): Payer: Medicare Other | Admitting: Family Medicine

## 2011-10-07 ENCOUNTER — Ambulatory Visit
Admission: RE | Admit: 2011-10-07 | Discharge: 2011-10-07 | Disposition: A | Discharge status: Home / Self Care | Payer: Medicare Other | Source: Ambulatory Visit | Attending: Family Medicine | Admitting: Family Medicine

## 2011-10-07 ENCOUNTER — Encounter: Payer: Self-pay | Admitting: Family Medicine

## 2011-10-07 ENCOUNTER — Other Ambulatory Visit: Payer: Self-pay | Admitting: Family Medicine

## 2011-10-07 VITALS — BP 123/71 | HR 81

## 2011-10-07 DIAGNOSIS — T849XXA Unspecified complication of internal orthopedic prosthetic device, implant and graft, initial encounter: Secondary | ICD-10-CM

## 2011-10-07 DIAGNOSIS — T8489XA Other specified complication of internal orthopedic prosthetic devices, implants and grafts, initial encounter: Secondary | ICD-10-CM

## 2011-10-07 DIAGNOSIS — F329 Major depressive disorder, single episode, unspecified: Secondary | ICD-10-CM

## 2011-10-07 DIAGNOSIS — Z23 Encounter for immunization: Secondary | ICD-10-CM

## 2011-10-07 MED ORDER — SERTRALINE HCL 50 MG PO TABS: 50.0000 mg | ORAL_TABLET | Freq: Every day | ORAL | Status: DC

## 2011-10-07 NOTE — Patient Instructions (Signed)

## 2011-10-10 NOTE — Progress Notes (Signed)
Patient ID: Arthur Frost, male   DOB: [birth year], 50 y.o.   MRN: [id number] #1S:  Patient's here because of right leg pain. According to his family he is those screws coming out this foot that that he had has surgery on. O: BP 123/71  Pulse 81  SpO2 100% Obese white male visually impaired Examination of the foot reveals an obvious orthopedic hardware protruding from the bone still covered by skin and intact Pulses DP and PT intact Assessment and plan : Shift in orthopedic hardware Will obtain x-ray of the foot Orthopedic referral to orthopedic choice of family #2 S: patient recently had surgery on both eyes apparently this of retinal damage I cannot be completely. 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 They're hoping for some visual return in the right eye for for the time being patient comes very depressed and he is being followed by the retinal specialist at [facility] in [place]. O: Patient teary eyes at times denies desire to hurt himself or others but does admit to being depressed A&P: In discussion with the family comes out that he has a brother who has been on Zoloft which is work well with, 50 mg one tablet a day  Follow Dr. Linford Arnold in about 4-6 weeks and. #3 Health maintenance Patient's health records were updated. And tetanus immunization was given today as well.

## 2011-11-04 ENCOUNTER — Other Ambulatory Visit: Payer: Self-pay | Admitting: Family Medicine

## 2011-11-09 ENCOUNTER — Encounter: Payer: Self-pay | Admitting: Family Medicine

## 2011-11-09 ENCOUNTER — Ambulatory Visit (INDEPENDENT_AMBULATORY_CARE_PROVIDER_SITE_OTHER): Payer: PRIVATE HEALTH INSURANCE | Admitting: Family Medicine

## 2011-11-09 VITALS — BP 115/71 | HR 86 | Wt 274.0 lb

## 2011-11-09 DIAGNOSIS — Z9889 Other specified postprocedural states: Secondary | ICD-10-CM

## 2011-11-09 DIAGNOSIS — Z1211 Encounter for screening for malignant neoplasm of colon: Secondary | ICD-10-CM

## 2011-11-09 DIAGNOSIS — H5710 Ocular pain, unspecified eye: Secondary | ICD-10-CM

## 2011-11-09 DIAGNOSIS — F329 Major depressive disorder, single episode, unspecified: Secondary | ICD-10-CM

## 2011-11-09 DIAGNOSIS — R5383 Other fatigue: Secondary | ICD-10-CM

## 2011-11-09 MED ORDER — SERTRALINE HCL 50 MG PO TABS: 100.0000 mg | ORAL_TABLET | Freq: Every day | ORAL | Status: DC

## 2011-11-09 NOTE — Progress Notes (Signed)
  Subjective:    Patient ID: Arthur Frost, male    DOB: 1961-03-02, 51 y.o.   MRN: [id number]  HPI #1 Depression family reports improvement but thinks the dosage needs increasing # 2 previous foot surgery w/loosing of the hardware #3 missed last 2 referrals for colonoscopy due to hospitalization #4 visual disturbances and R eye pain #69fatigue  Review of Systems    BP 115/71  Pulse 86  Wt 274 lb (124.286 kg)  SpO2 98% Objective:   Physical Exam  Blind WM no acute distress Affect more animated and mood more up beat. Xray w/out any acute changes with his R foot R eye markedly hyperemic    Assessment & Plan:  #1 Depression increase Zoloft to 100 mg po a day.  #2 Foot hardware evaluation needs to be done despite no acute changes on X-ray. Referral to Dr Ranell Patrick #3 GI referral needed for cancer screening #4saw  The Opthomologist today Dx him w/a R eye infection the cause of the pain and some light perception in the L eye will have to wait #5 fatigue on vitamins now. Will give sample of XS to try use every other day when not having dialysis and see what happens. If improvement not forthcoming can try to get EBV,TSH, CBC and B12 but these may already are being drawn by Nephrologist.

## 2011-11-09 NOTE — Patient Instructions (Signed)
Patient needs refill and dosage modification of Zoloft. Referral to orthopedic for evaluation  Of screw in foot. Will also need repeat GI referral for colonoscopy. Return to see Dr Linford Arnold in 2 months.

## 2011-11-23 ENCOUNTER — Ambulatory Visit: Payer: Medicare Other | Admitting: Family Medicine

## 2011-11-24 LAB — LIPID PANEL
Cholesterol: 205 mg/dL — AB (ref 0–200)
HDL: 43 mg/dL (ref 35–70)
LDL Cholesterol: 105 mg/dL

## 2011-11-24 LAB — HEMOGLOBIN A1C: Hgb A1c MFr Bld: 7.5 % — AB (ref 4.0–6.0)

## 2011-11-30 ENCOUNTER — Ambulatory Visit (INDEPENDENT_AMBULATORY_CARE_PROVIDER_SITE_OTHER): Payer: PRIVATE HEALTH INSURANCE | Admitting: Family Medicine

## 2011-11-30 ENCOUNTER — Encounter: Payer: Self-pay | Admitting: Family Medicine

## 2011-11-30 VITALS — BP 122/71 | HR 82 | Wt 274.0 lb

## 2011-11-30 DIAGNOSIS — I10 Essential (primary) hypertension: Secondary | ICD-10-CM

## 2011-11-30 DIAGNOSIS — E669 Obesity, unspecified: Secondary | ICD-10-CM | POA: Insufficient documentation

## 2011-11-30 MED ORDER — DOXAZOSIN MESYLATE 4 MG PO TABS: 4.0000 mg | ORAL_TABLET | Freq: Every day | ORAL | Status: DC

## 2011-11-30 MED ORDER — INSULIN ASPART 100 UNIT/ML ~~LOC~~ SOLN: 15.0000 [IU] | Freq: Three times a day (TID) | SUBCUTANEOUS | Status: DC

## 2011-11-30 NOTE — Progress Notes (Signed)
  Subjective:    Patient ID: Arthur Frost, male    DOB: Mar 07, 1961, 51 y.o.   MRN: [id number]  Diabetes He presents for his follow-up diabetic visit. He has type 2 diabetes mellitus. His disease course has been stable. There are no hypoglycemic associated symptoms. Pertinent negatives for diabetes include no chest pain, no polydipsia, no polyphagia, no polyuria and no weight loss. There are no hypoglycemic complications. Symptoms are stable. He is compliant with treatment all of the time. His weight is stable. He is following a generally healthy diet. He has not had a previous visit with a dietician. He rarely participates in exercise. His home blood glucose trend is decreasing steadily. His breakfast blood glucose is taken between 8-9 am. An ACE inhibitor/angiotensin II receptor blocker is contraindicated. Eye exam is current.   DM- A1C is 7.5.  Diet is under control Sugar was 97 this AM.  Yesterday 102.  No hypoglycemic event.    HTN - dong well. The chest pressure as the breath. He takes medications regularly. He is not getting regular exercise.    Review of Systems  Constitutional: Negative for weight loss.  Cardiovascular: Negative for chest pain.  Genitourinary: Negative for polyuria.  Hematological: Negative for polydipsia and polyphagia.       Objective:   Physical Exam  Constitutional: He is oriented to person, place, and time. He appears well-developed and well-nourished.  HENT:  Head: Normocephalic and atraumatic.  Cardiovascular: Normal rate, regular rhythm and normal heart sounds.   Pulmonary/Chest: Effort normal and breath sounds normal.  Neurological: He is alert and oriented to person, place, and time.  Skin: Skin is warm and dry.  Psychiatric: He has a normal mood and affect. His behavior is normal.          Assessment & Plan:  DM- doing well.  A1C was 7.5 in January. Drawn at dialysis.  Home Sugars have been great over the last 2 weeks. F/U in 3 months. Continue  current regimen. We are going to get his lab work back safer. He did go off his Levemir for time but restarted a couple weeks ago. His sister who is with him today that his blood sugars have been running well under 130 since he restarted the Levemir. He does need refills on some of his medications today. Followup in 3 months.  HTN - blood pressures are well controlled today. This is primarily managed through his dialysis. But he does need a refill on his doxazosin today. He does not really fit a wheelchair but we worked on trying to increase his activity level with flexor chest and arm stretches. He has been very inactive.

## 2011-12-02 ENCOUNTER — Other Ambulatory Visit: Payer: Self-pay | Admitting: *Deleted

## 2011-12-02 MED ORDER — AMBULATORY NON FORMULARY MEDICATION: Status: DC

## 2011-12-07 ENCOUNTER — Encounter: Payer: Self-pay | Admitting: *Deleted

## 2012-01-04 ENCOUNTER — Ambulatory Visit: Payer: Medicare Other | Admitting: Family Medicine

## 2012-01-11 ENCOUNTER — Other Ambulatory Visit: Payer: Self-pay | Admitting: Pain Medicine

## 2012-01-11 ENCOUNTER — Ambulatory Visit
Admission: RE | Admit: 2012-01-11 | Discharge: 2012-01-11 | Disposition: A | Discharge status: Home / Self Care | Payer: Medicare Other | Source: Ambulatory Visit | Attending: Pain Medicine | Admitting: Pain Medicine

## 2012-01-11 DIAGNOSIS — M25559 Pain in unspecified hip: Secondary | ICD-10-CM

## 2012-01-20 ENCOUNTER — Encounter: Payer: Self-pay | Admitting: Family Medicine

## 2012-01-20 ENCOUNTER — Other Ambulatory Visit: Payer: Self-pay | Admitting: Family Medicine

## 2012-02-15 ENCOUNTER — Telehealth: Payer: Self-pay | Admitting: *Deleted

## 2012-02-15 MED ORDER — AMBULATORY NON FORMULARY MEDICATION: Status: DC

## 2012-02-15 NOTE — Telephone Encounter (Signed)
LM for [place] to return call

## 2012-02-15 NOTE — Telephone Encounter (Signed)
I cna write a new rx and they can take to DME store (such as [employer])

## 2012-02-15 NOTE — Telephone Encounter (Signed)
Pt's sister informed.

## 2012-02-15 NOTE — Telephone Encounter (Signed)
Pt 's sister is wanting to know how she can go about to get an over sized wheelchair. She states he keeps getting stuck in wheelchair when getting up and down.

## 2012-02-22 ENCOUNTER — Encounter: Payer: Self-pay | Admitting: Family Medicine

## 2012-02-22 ENCOUNTER — Other Ambulatory Visit: Payer: Self-pay | Admitting: *Deleted

## 2012-02-22 ENCOUNTER — Ambulatory Visit (INDEPENDENT_AMBULATORY_CARE_PROVIDER_SITE_OTHER): Payer: PRIVATE HEALTH INSURANCE | Admitting: Family Medicine

## 2012-02-22 VITALS — BP 110/63 | HR 75 | Wt 277.0 lb

## 2012-02-22 DIAGNOSIS — L8992 Pressure ulcer of unspecified site, stage 2: Secondary | ICD-10-CM

## 2012-02-22 DIAGNOSIS — H547 Unspecified visual loss: Secondary | ICD-10-CM

## 2012-02-22 DIAGNOSIS — E1142 Type 2 diabetes mellitus with diabetic polyneuropathy: Secondary | ICD-10-CM

## 2012-02-22 DIAGNOSIS — R29898 Other symptoms and signs involving the musculoskeletal system: Secondary | ICD-10-CM

## 2012-02-22 DIAGNOSIS — E1149 Type 2 diabetes mellitus with other diabetic neurological complication: Secondary | ICD-10-CM

## 2012-02-22 DIAGNOSIS — N19 Unspecified kidney failure: Secondary | ICD-10-CM

## 2012-02-22 DIAGNOSIS — L8991 Pressure ulcer of unspecified site, stage 1: Secondary | ICD-10-CM

## 2012-02-22 DIAGNOSIS — L89609 Pressure ulcer of unspecified heel, unspecified stage: Secondary | ICD-10-CM

## 2012-02-22 DIAGNOSIS — L89309 Pressure ulcer of unspecified buttock, unspecified stage: Secondary | ICD-10-CM

## 2012-02-22 DIAGNOSIS — L8993 Pressure ulcer of unspecified site, stage 3: Secondary | ICD-10-CM

## 2012-02-22 MED ORDER — AMBULATORY NON FORMULARY MEDICATION: Status: DC

## 2012-02-22 MED ORDER — SIMVASTATIN 40 MG PO TABS: 40.0000 mg | ORAL_TABLET | Freq: Every day | ORAL | Status: DC

## 2012-02-22 NOTE — Progress Notes (Signed)
Subjective:    Patient ID: Arthur Frost, male    DOB: Sep 07, 1961, 51 y.o.   MRN: [id number]  HPI He is here with his sister today who is his primary caretaker. He was recently hospitalized again. When she took him home from the hospital she noticed that he had some bed sores around his buttock area as well as on his heels. She said she didn't see the one on his heels as well until last night when he was lying in bed and she got a better look at him. She wants and that he may need to go to a rehabilitation facility where they can work on improving his wounds and also work on helping him with transfers. She stays very weak and debilitated. He is no longer able to walk. He does have an electric wheelchair but unfortunately he cannot use it because he cannot see well enough now to operate it. His sister who is a former nurse has been applying some wound dressings to the areas. He's also had some loose stools because of constipation issues he is now softer and that has also aggravated the decubitus ulcers on his buttock area. No fevers or spreading erythema or redness. He is currently using his sister's old wheelchair which is much too narrow for him. His hips get caught in the sides. We did try) new prescription for a wider wheelchair for him but it was denied because he had already received an electric wheelchair a year or so ago.  DM- he was previously well controlled on 35 units of Levemir in the evening and 18 units at bedtime. Lately his sugars running in the 160s. His sister increased to 40 units of Levemir.   Review of Systems     Objective:   Physical Exam  Constitutional: He is oriented to person, place, and time. He appears well-developed and well-nourished.       He is obese.  HENT:  Head: Normocephalic and atraumatic.  Neurological: He is alert and oriented to person, place, and time.  Skin:       He had a approximately 1 x 3 cm decubitus ulcer approximately stage II on the buttock over the  sacrum. Unfortunately we were not able to get him up on the table to make sure that he did not have any others in that location. On his right heel he had a very large posterior that wrapped around the heel. It was approximately 4 cm x 10 cm. I did lance the blister with a sterile blade after cleansing the wound with alcohol. A fair amount of pink serous fluid was released. I did remove some of the skin off of the surface but there is actually a pocket of skin underneath that is detached as well. On his left heel, medially he has a stage I erythematous ulcer. On the left heel, laterally he has a stage II ulcer. DuoDERM was placed over the wound on his right heel and on the wound on his left lateral heel. Coban was used to hold the DuoDERM in place.  Psychiatric: He has a normal mood and affect. His behavior is normal.          Assessment & Plan:  Sacral decubitus ulcer, stage II-will refer home health for wound care. I would like to see him back in one month to make sure it continues to improve. Certainly will be in touch with the home health when care specialist. DuoDERM was applied after cleansing and lancing the blister.  Right heel ulcer, stage II-see above. DuoDERM was applied after cleansing and lancing the blister.   Left heel ulcer, stage I and stage II-see above.  Inability to walk secondary to end-stage renal disease, blindness-I would like to get home health involved to have PT and OT with the patient on transfers. Right now he is completely dependent on his sister to move him and let him. I do think he would have some potential for recovery to at least help assist with transfers. Unfortunately when I cannot get him a larger wheelchair. I mentioned to assist her to try taking at the [facility] here [place]. They have some use medical supplies which might be helpful.  DM- under fair control. I did encourage him to increase his Levemir by 1 unit today and her fastings are under  130.   Lab Results  Component Value Date   HGBA1C 7.5* 11/24/2011

## 2012-02-24 ENCOUNTER — Telehealth: Payer: Self-pay | Admitting: *Deleted

## 2012-02-24 DIAGNOSIS — L89159 Pressure ulcer of sacral region, unspecified stage: Secondary | ICD-10-CM

## 2012-02-24 NOTE — Telephone Encounter (Signed)
[place] states [facility] would be better but that they can only do it on tuesdays or Thursdays due to his dialysis on M-W-F.

## 2012-02-24 NOTE — Telephone Encounter (Signed)
Ok. Referral placed

## 2012-02-24 NOTE — Telephone Encounter (Signed)
Okay. [facility]. Please call him or his sister [place] and find out if he prefers [place] or [place].

## 2012-02-24 NOTE — Telephone Encounter (Signed)
[facility] Nurse) has called to let us know that she thinks the pt needs to go to the wd care center for his decubitus. States she placed optic foam and covered it on the rt heel. Please advise.

## 2012-02-29 ENCOUNTER — Ambulatory Visit: Payer: Medicare Other | Admitting: Family Medicine

## 2012-03-06 DIAGNOSIS — L89309 Pressure ulcer of unspecified buttock, unspecified stage: Secondary | ICD-10-CM

## 2012-03-06 DIAGNOSIS — L8995 Pressure ulcer of unspecified site, unstageable: Secondary | ICD-10-CM

## 2012-03-06 DIAGNOSIS — E1149 Type 2 diabetes mellitus with other diabetic neurological complication: Secondary | ICD-10-CM

## 2012-03-06 DIAGNOSIS — L89609 Pressure ulcer of unspecified heel, unspecified stage: Secondary | ICD-10-CM

## 2012-03-23 ENCOUNTER — Ambulatory Visit: Payer: Medicare Other | Admitting: Family Medicine

## 2012-04-19 LAB — LIPID PANEL
Cholesterol: 182 mg/dL (ref 0–200)
LDL Cholesterol: 99 mg/dL
Triglycerides: 251 mg/dL — AB (ref 40–160)

## 2012-04-19 LAB — CBC AND DIFFERENTIAL
HCT: 38 % — AB (ref 41–53)
Hemoglobin: 12.1 g/dL — AB (ref 13.5–17.5)

## 2012-04-19 LAB — HEPATIC FUNCTION PANEL: Alkaline Phosphatase: 69 U/L (ref 25–125)

## 2012-04-19 LAB — BASIC METABOLIC PANEL: Potassium: 5.5 mmol/L — AB (ref 3.4–5.3)

## 2012-04-27 ENCOUNTER — Telehealth: Payer: Self-pay | Admitting: *Deleted

## 2012-04-27 NOTE — Telephone Encounter (Signed)
Mineka, physical therapist with [facility] called in regards of pt receiving PT 2 times a week for 3 weeks. I gave a verbal order for this.

## 2012-04-27 NOTE — Telephone Encounter (Signed)
Perfect. I really think he could benefit from this.

## 2012-05-04 ENCOUNTER — Ambulatory Visit: Payer: Medicare Other | Admitting: Family Medicine

## 2012-05-09 ENCOUNTER — Telehealth: Payer: Self-pay | Admitting: Family Medicine

## 2012-05-09 ENCOUNTER — Encounter: Payer: Self-pay | Admitting: Family Medicine

## 2012-05-09 ENCOUNTER — Ambulatory Visit (INDEPENDENT_AMBULATORY_CARE_PROVIDER_SITE_OTHER): Payer: PRIVATE HEALTH INSURANCE | Admitting: Family Medicine

## 2012-05-09 VITALS — HR 76 | Ht 69.0 in | Wt 278.0 lb

## 2012-05-09 DIAGNOSIS — L8990 Pressure ulcer of unspecified site, unspecified stage: Secondary | ICD-10-CM

## 2012-05-09 DIAGNOSIS — N186 End stage renal disease: Secondary | ICD-10-CM

## 2012-05-09 DIAGNOSIS — E119 Type 2 diabetes mellitus without complications: Secondary | ICD-10-CM

## 2012-05-09 DIAGNOSIS — M6281 Muscle weakness (generalized): Secondary | ICD-10-CM

## 2012-05-09 DIAGNOSIS — E1129 Type 2 diabetes mellitus with other diabetic kidney complication: Secondary | ICD-10-CM

## 2012-05-09 DIAGNOSIS — R5381 Other malaise: Secondary | ICD-10-CM

## 2012-05-09 DIAGNOSIS — R531 Weakness: Secondary | ICD-10-CM

## 2012-05-09 MED ORDER — SERTRALINE HCL 50 MG PO TABS: 100.0000 mg | ORAL_TABLET | Freq: Every day | ORAL | Status: DC

## 2012-05-09 MED ORDER — AMBULATORY NON FORMULARY MEDICATION: Status: DC

## 2012-05-09 NOTE — Telephone Encounter (Signed)
Call pt:  [facility] to get copy of labs, specifically the [facility]

## 2012-05-09 NOTE — Progress Notes (Signed)
  Subjective:    Patient ID: Arthur Frost, male    DOB: 1961-10-07, 51 y.o.   MRN: [id number]  HPI Was ;in the hospital recently for hypotensive episodes.  He has end-stage renal disease and gets dialysis 3 days per week.  Was in the nursing facility for 6-7 weeks.  Went home on July 9th. Ulcers on the heels are healed.  Does still having once decubitis on his buttock. Now dime sized and healing fair.  Getting smaller.  Using gel on it with no bandage.  Is being followed by wound care via home health. He is also getting physical therapy through home health for at least the next 2 weeks. His sister who is here with him today says it has really helped. He is now able to assist in transitioning from chair to bed. There he still needs assistance. He is unable to walk.  End-stage renal disease - On dialysis 3 x a day. Bumex was recently started back after he was discharged from a nursing home by his nephrologist. He does still make some urine. He denies any significant edema.   Review of Systems     Objective:   Physical Exam  Constitutional: He is oriented to person, place, and time. He appears well-developed and well-nourished.  HENT:  Head: Normocephalic and atraumatic.  Cardiovascular: Normal rate, regular rhythm and normal heart sounds.   Pulmonary/Chest: Effort normal and breath sounds normal.  Musculoskeletal: He exhibits no edema.  Neurological: He is alert and oriented to person, place, and time.  Skin: Skin is warm and dry.       He has a stage II approximately 1/2 cm round decubitus ulcer that is fairly shallow near the buttock crease to the right. No active drainage.  Psychiatric: He has a normal mood and affect. His behavior is normal.          Assessment & Plan:  Decubitis ulcer on buttock - appears to be healing well. Unfortunately dialysis tends to stimulate bowel movements for him and it is difficult for them to clean him well while he is attached to the dialysis machine. This  means that he ends up getting stool on his wounds and makes it more difficult to heal completely. His sister continues to keep an eye on this and he still has home health coming out when care at least the next 2 weeks.  Generalized weakness-he does seem to be getting a little bit better and seems a little bit stronger today. Continue to work on home exercises and stretches to keep his strength up and help with transitioning from chair to bed etc.  End-stage renal disease-getting dialysis 3 days per week.  Diabetes-he says they check his A1c at the beginning of the month. We will call on dialysis center to get a copy of his lab work so that we can make sure that his diabetes is well controlled. The last time we id a fingerstick in the office insurance did not pay for it. He has blindness. His eye exam is up-to-date. He reports his sugars have been well-controlled home but did not bring in his glucose log today.

## 2012-05-10 NOTE — Telephone Encounter (Signed)
[phone] [facility], they are faxing labs over

## 2012-05-16 ENCOUNTER — Telehealth: Payer: Self-pay | Admitting: Family Medicine

## 2012-05-16 NOTE — Telephone Encounter (Signed)
Call pt: last a1c is 6.4 which is fantastic, Keep up the good work!

## 2012-05-18 ENCOUNTER — Encounter: Payer: Self-pay | Admitting: *Deleted

## 2012-05-18 NOTE — Telephone Encounter (Signed)
LMOM informing Pt  

## 2012-06-21 ENCOUNTER — Other Ambulatory Visit: Payer: Self-pay | Admitting: *Deleted

## 2012-06-21 MED ORDER — INSULIN DETEMIR 100 UNIT/ML ~~LOC~~ SOLN: 35.0000 [IU] | Freq: Every day | SUBCUTANEOUS | Status: DC

## 2012-07-04 ENCOUNTER — Ambulatory Visit: Payer: Medicare Other | Admitting: Family Medicine

## 2012-07-17 ENCOUNTER — Telehealth: Payer: Self-pay | Admitting: *Deleted

## 2012-07-17 DIAGNOSIS — R531 Weakness: Secondary | ICD-10-CM

## 2012-07-17 DIAGNOSIS — N19 Unspecified kidney failure: Secondary | ICD-10-CM

## 2012-07-17 NOTE — Telephone Encounter (Signed)
Sister states that she would like to get some home PT for pt since he can't walk. States that she is having trouble even getting the transport company to take him to his appts b/c he can't walk and she can't get him in the car.

## 2012-07-17 NOTE — Telephone Encounter (Signed)
Ok to order. Let me know if any problems putting in referral. Dx  - generalized weakness.  Renal failure.

## 2012-07-17 NOTE — Telephone Encounter (Signed)
Sister informed.

## 2012-07-20 ENCOUNTER — Other Ambulatory Visit: Payer: Self-pay | Admitting: Family Medicine

## 2012-07-20 DIAGNOSIS — R531 Weakness: Secondary | ICD-10-CM

## 2012-07-20 DIAGNOSIS — N19 Unspecified kidney failure: Secondary | ICD-10-CM

## 2012-07-20 MED ORDER — INSULIN DETEMIR 100 UNIT/ML ~~LOC~~ SOLN: 42.0000 [IU] | Freq: Every day | SUBCUTANEOUS | Status: DC

## 2012-07-26 ENCOUNTER — Telehealth: Payer: Self-pay | Admitting: *Deleted

## 2012-07-26 NOTE — Telephone Encounter (Signed)
Physical therapist calls to get verbal order to treat pt 2 x week for 4 weeks. Verbal order given

## 2012-07-26 NOTE — Telephone Encounter (Signed)
Approved.  

## 2012-08-01 DIAGNOSIS — N186 End stage renal disease: Secondary | ICD-10-CM

## 2012-08-01 DIAGNOSIS — M6281 Muscle weakness (generalized): Secondary | ICD-10-CM

## 2012-08-01 DIAGNOSIS — E1129 Type 2 diabetes mellitus with other diabetic kidney complication: Secondary | ICD-10-CM

## 2012-08-01 DIAGNOSIS — N058 Unspecified nephritic syndrome with other morphologic changes: Secondary | ICD-10-CM

## 2012-08-04 ENCOUNTER — Telehealth: Payer: Self-pay | Admitting: *Deleted

## 2012-08-04 NOTE — Telephone Encounter (Signed)
OK for order? 

## 2012-08-04 NOTE — Telephone Encounter (Signed)
Brother calls and states that Arthur Frost is in the hospital so physical therapy had to discharge him and he says that a new order will be needed when discharged

## 2012-08-21 ENCOUNTER — Telehealth: Payer: Self-pay

## 2012-08-21 NOTE — Telephone Encounter (Signed)
Sounds good. Thank you

## 2012-08-21 NOTE — Telephone Encounter (Signed)
FYI Physical Therapy called and would like to extend Arthur Frost's Physical Therapy for 2 x weekly for 4 weeks. I gave them the ok to continue Physical Therapy, per note in October.

## 2012-09-01 ENCOUNTER — Telehealth: Payer: Self-pay | Admitting: *Deleted

## 2012-09-01 NOTE — Telephone Encounter (Signed)
Nurse with [facility] calls and states patient has a ? Blood blister the size of quarter on right heel that has came up in the last few days. She feels he needs appointment to have looked at since he is a diabetic. Has dialysis on M,W, and Fridays.

## 2012-09-01 NOTE — Telephone Encounter (Signed)
Yes need appt. Can he come today? If not then have him elevate and keep pressure off area until Monday.

## 2012-09-01 NOTE — Telephone Encounter (Signed)
Spoke with nurse and pt can't come today he is at dialysis and won't be home until after 6. She said they did put a duoderm over the area to help protect it. I will have Victorino Dike call him and put him on schedule for Tuesday and I will call nurse and she will notify pt as well this evening once he is home

## 2012-09-01 NOTE — Telephone Encounter (Signed)
Patient scheduled for Tuesday Nov. 19th at 2:00 with you. Nurse notified as well, along with brother notififed by Victorino Dike

## 2012-09-05 ENCOUNTER — Encounter: Payer: Self-pay | Admitting: Family Medicine

## 2012-09-05 ENCOUNTER — Ambulatory Visit (INDEPENDENT_AMBULATORY_CARE_PROVIDER_SITE_OTHER): Payer: Medicare Other | Admitting: Family Medicine

## 2012-09-05 VITALS — BP 122/82 | HR 82

## 2012-09-05 DIAGNOSIS — L8993 Pressure ulcer of unspecified site, stage 3: Secondary | ICD-10-CM

## 2012-09-05 DIAGNOSIS — E119 Type 2 diabetes mellitus without complications: Secondary | ICD-10-CM

## 2012-09-05 DIAGNOSIS — L89893 Pressure ulcer of other site, stage 3: Secondary | ICD-10-CM

## 2012-09-05 DIAGNOSIS — N186 End stage renal disease: Secondary | ICD-10-CM

## 2012-09-05 DIAGNOSIS — E1139 Type 2 diabetes mellitus with other diabetic ophthalmic complication: Secondary | ICD-10-CM

## 2012-09-05 DIAGNOSIS — L89609 Pressure ulcer of unspecified heel, unspecified stage: Secondary | ICD-10-CM

## 2012-09-05 NOTE — Patient Instructions (Signed)
We will work on getting you schedule with the wound care center in [place] on thrusday.

## 2012-09-05 NOTE — Progress Notes (Signed)
  Subjective:    Patient ID: Arthur Frost, male    DOB: April 01, 1961, 51 y.o.   MRN: [id number]  HPI Here to eval ulcer on his right heel.  [facility] nurse called  5 days ago to let us know had blister on it.  Bandage placed and told not to removed until appt here. Unable to come in sooner since has dialysis M,W, F.  Had some drainage initially.   DM- no hypoglycemic events.  Doing well. No change on insulin.  Eating regularly.  Review of Systems     Objective:   Physical Exam  Constitutional: He is oriented to person, place, and time. He appears well-developed and well-nourished.  HENT:  Head: Normocephalic and atraumatic.  Cardiovascular: Normal rate, regular rhythm and normal heart sounds.   Pulmonary/Chest: Effort normal and breath sounds normal.  Neurological: He is alert and oriented to person, place, and time.  Skin: Skin is warm and dry.  Psychiatric: He has a normal mood and affect. His behavior is normal.    Right heel with larg black eschar approxl 4 cm in size. Bleeds easily and has a foul odor. No active drainage.        Assessment & Plan:  Decubitus ulcer right heel.  Wound cleaned. Tried to probe it and doesn't appear to be deep.  Recommend referral to wound care center for further evaluation.  Duoderm placed and coban used to hold the dioderm in place.    DM- WEll controled. F/U in 3 mo.  Flu vaccine given.  Lab Results  Component Value Date   HGBA1C 6.4* 04/19/2012

## 2012-09-06 ENCOUNTER — Telehealth: Payer: Self-pay | Admitting: *Deleted

## 2012-09-06 NOTE — Telephone Encounter (Signed)
Nurse w/ [facility] calls and wanted to know since it may take a couple of weeks for pt to be seen at the wound center for his heel do you want them to treat it. If so need orders as to what to treat with, how many times a day to change, and how many times a week for what period of time.

## 2012-09-06 NOTE — Telephone Encounter (Signed)
Ok for both wound care 2x a week adn for the wound cleanser.

## 2012-09-06 NOTE — Telephone Encounter (Signed)
Nurse calls and states can they do wound care 2x weekly due to pt dialysis schedule? Also Medicare will not cover the Hibiclense- they can use wound cleanser which works just as well with the duoderm. Pt appt at wound center is December 3

## 2012-09-06 NOTE — Addendum Note (Signed)
Addended by: Judie Petit A on: 09/06/2012 08:55 AM   Modules accepted: Orders

## 2012-09-06 NOTE — Telephone Encounter (Signed)
Orders given via VM. Instructed to call back if any questions

## 2012-09-06 NOTE — Telephone Encounter (Signed)
Ok for verbal for wound care. Debride, clean with hibiclens nad place duoderm every 3 days. Would care center scheduled for 09/14/12

## 2012-09-07 NOTE — Telephone Encounter (Signed)
Nurse notified of verbal orders

## 2012-09-18 ENCOUNTER — Telehealth: Payer: Self-pay | Admitting: *Deleted

## 2012-09-18 NOTE — Telephone Encounter (Signed)
Agree as below

## 2012-09-18 NOTE — Telephone Encounter (Signed)
Physical therapist calls requesting verbal orders to continue PT in the home 2 times a week for 4 weeks for strengthing, gate balance and training. Verbal order given.

## 2012-09-25 ENCOUNTER — Telehealth: Payer: Self-pay | Admitting: *Deleted

## 2012-09-25 NOTE — Telephone Encounter (Signed)
Pt brother called just to let you know that this patient was admitted to hspital Sunday and had surgery. Had air bubble in colon.  FYI

## 2012-09-26 HISTORY — PX: RIGHT COLECTOMY: SHX853

## 2012-10-03 ENCOUNTER — Ambulatory Visit: Payer: Medicare Other | Admitting: Family Medicine

## 2012-11-27 IMAGING — CR DG HIP (WITH OR WITHOUT PELVIS) 2-3V*L*
3 series · 3 of 3 positions shown · non-contrast
Comparison: None.

CLINICAL DATA: Left hip pain, chronic.  No history of injury.

LEFT HIP - COMPLETE 2+ VIEW

[view not recorded (1 of 3)]
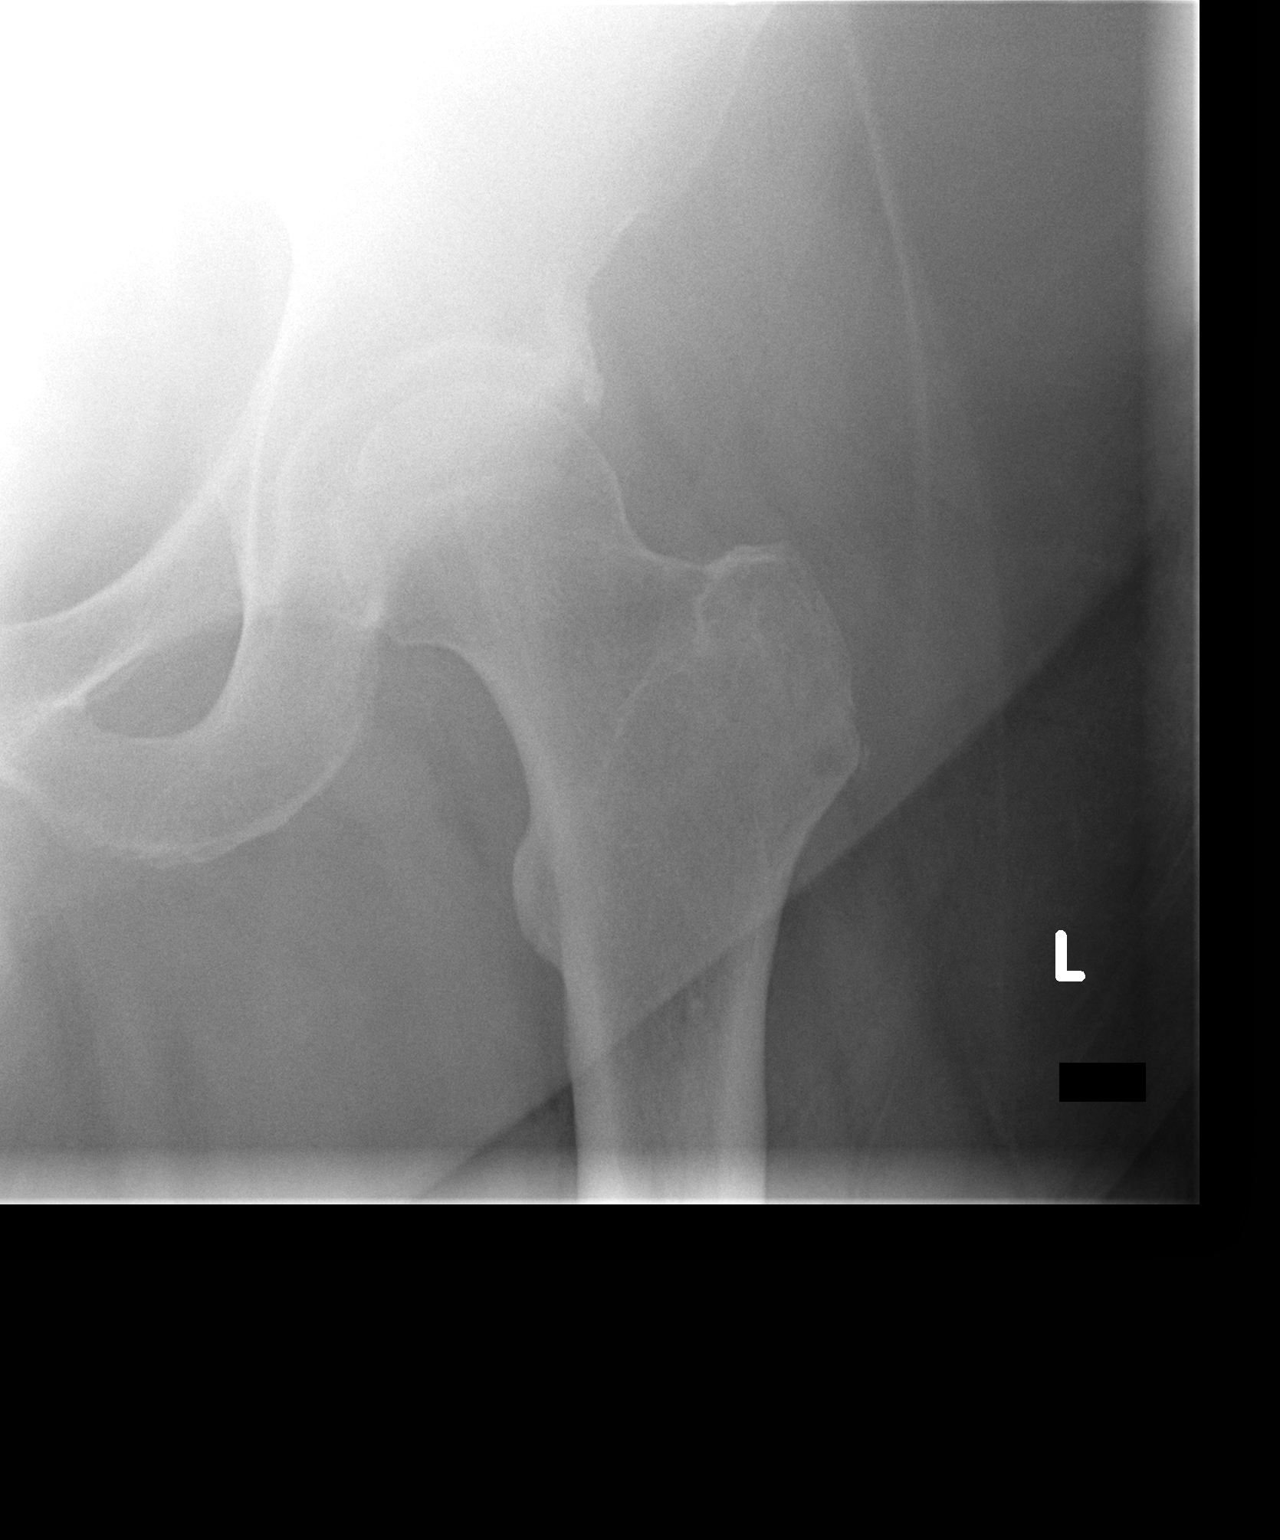

[view not recorded (2 of 3)]
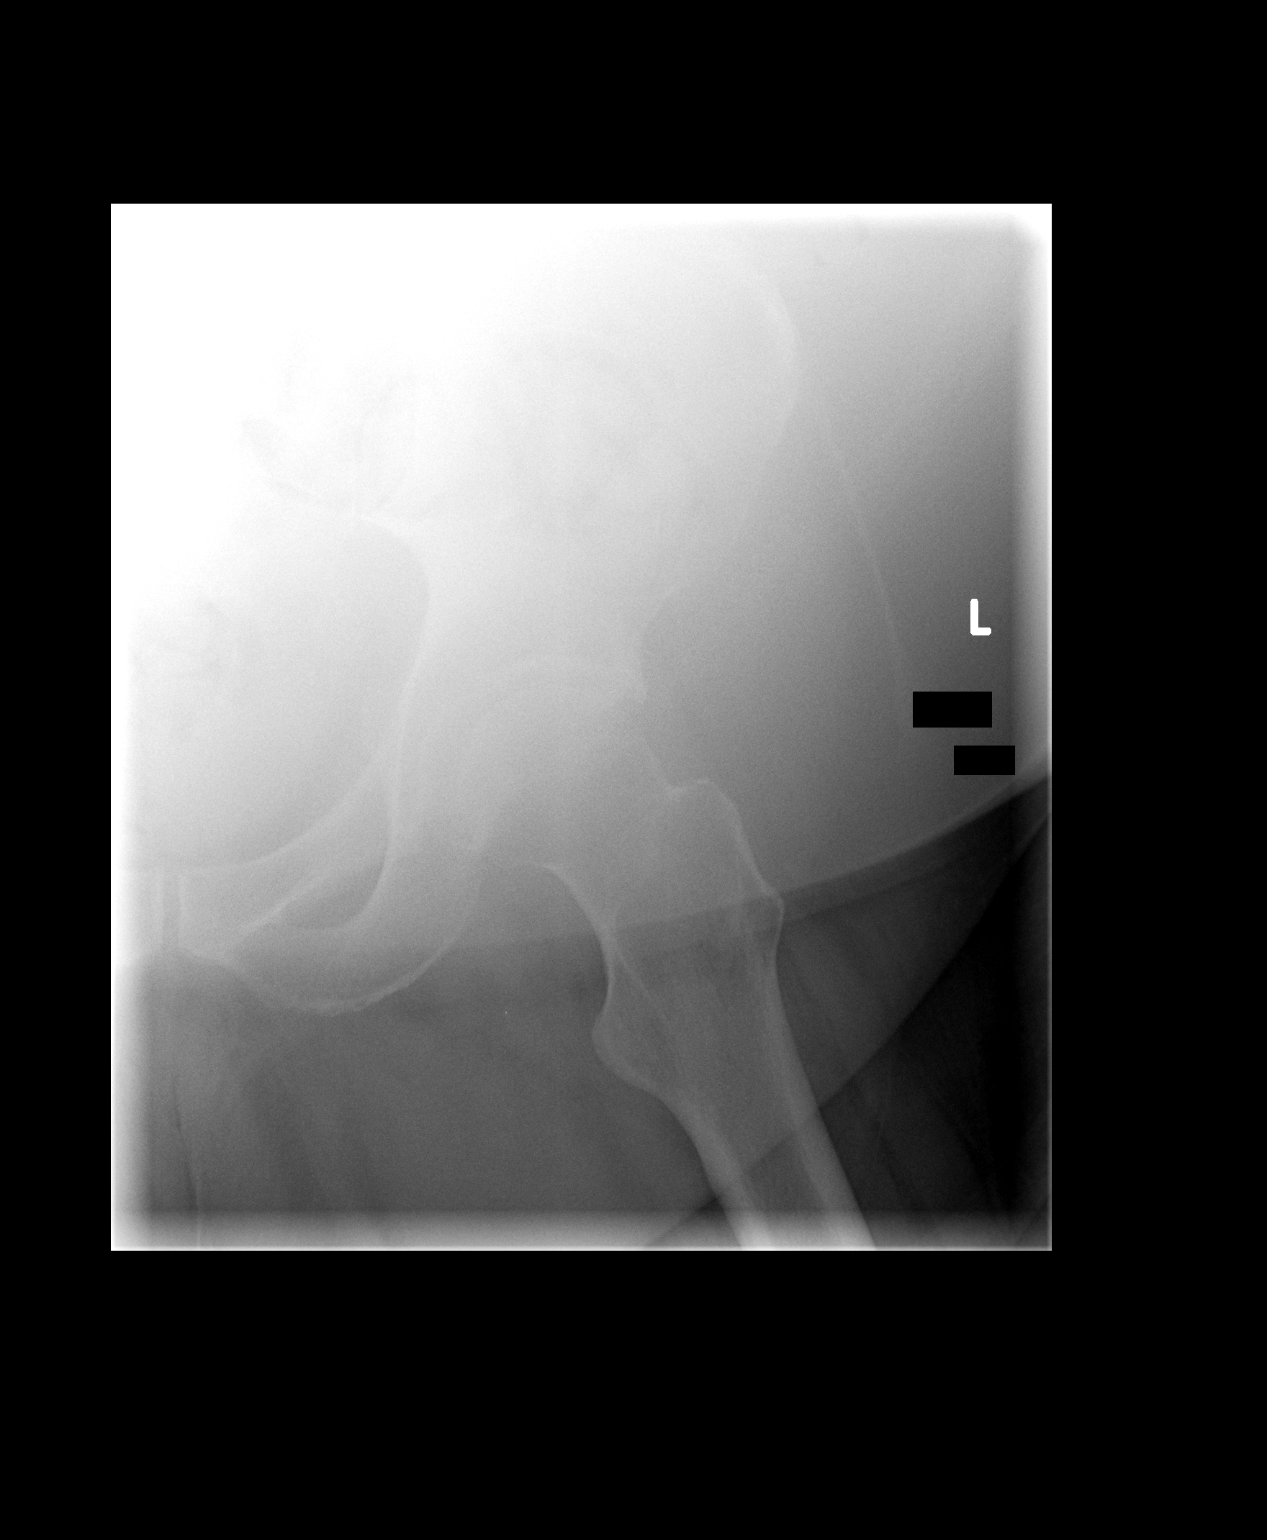

[view not recorded (3 of 3)]
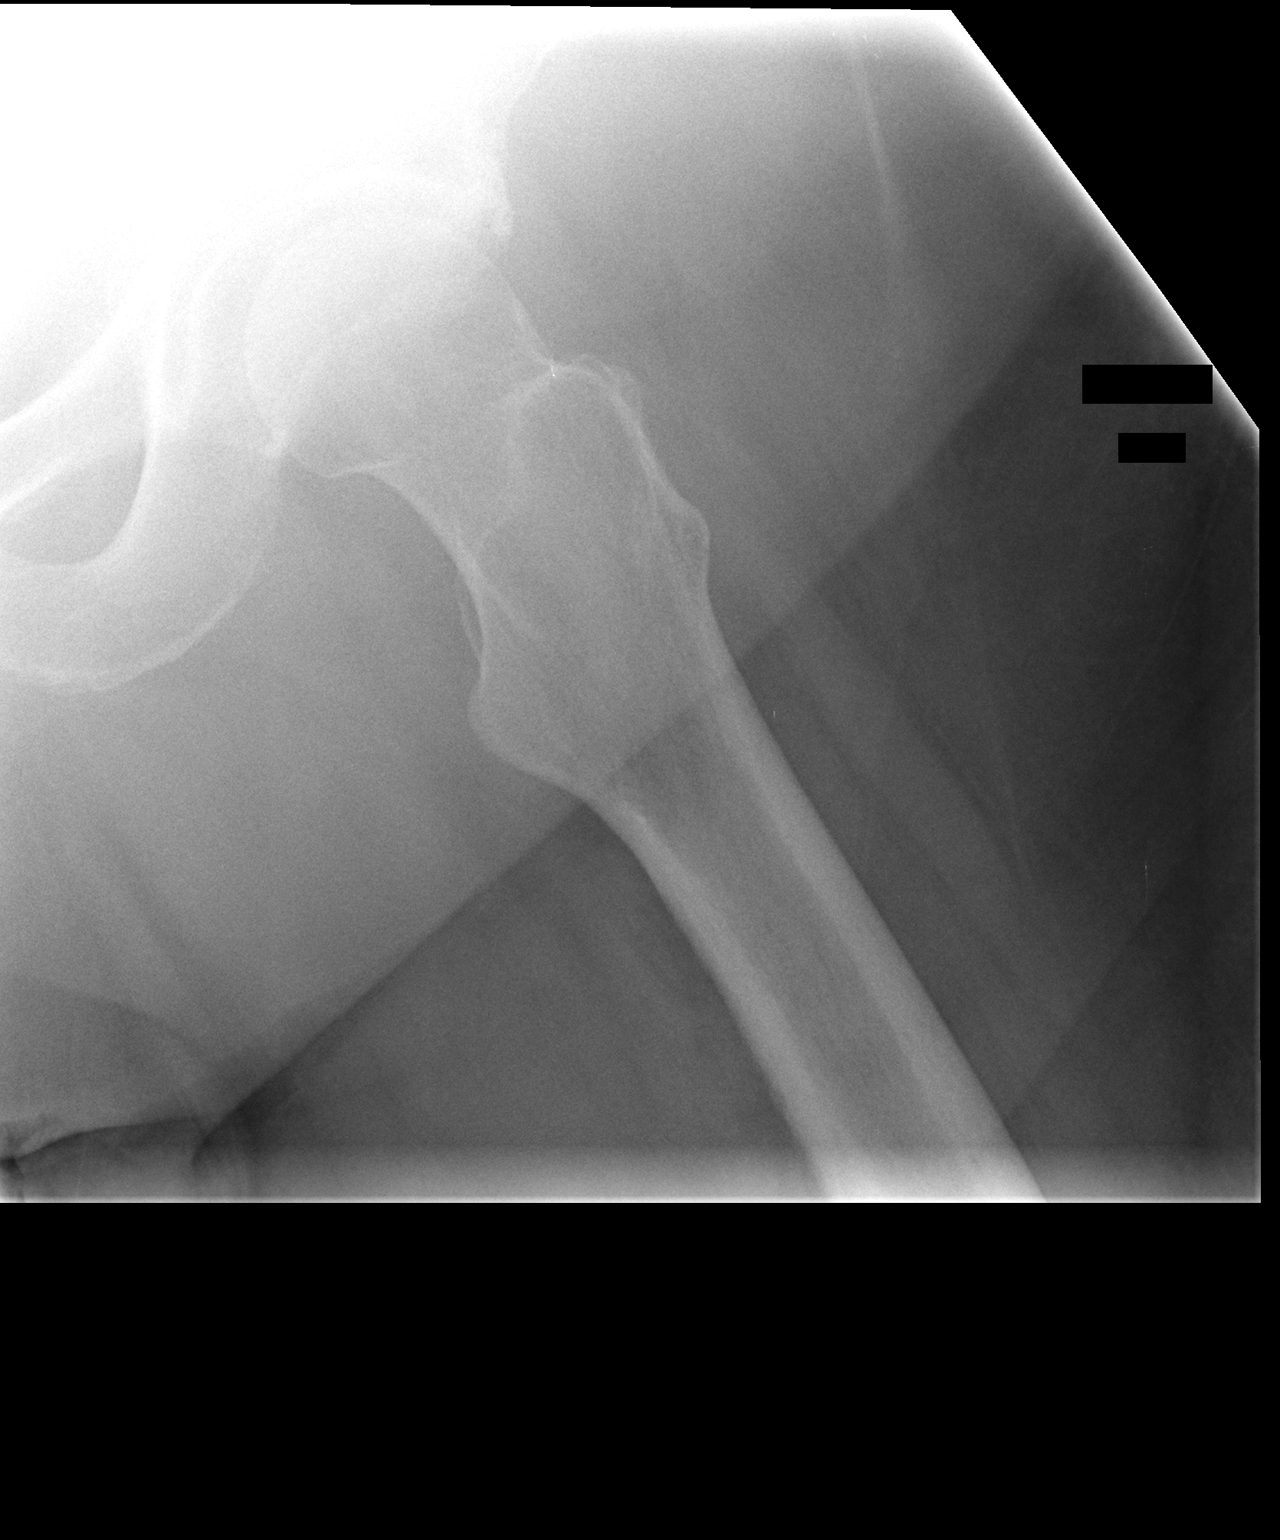

[3 of 3 positions shown; findings below may reference images not displayed]

FINDINGS: Study limited by body habitus.  No visible acute bony
abnormality.  Suspect early degenerative changes in the left hip.
IMPRESSION: No acute findings.

## 2013-01-10 HISTORY — PX: LEG AMPUTATION ABOVE KNEE: SHX117

## 2013-02-16 ENCOUNTER — Encounter: Payer: Self-pay | Admitting: Family Medicine

## 2013-04-26 ENCOUNTER — Other Ambulatory Visit: Payer: Self-pay

## 2014-07-25 LAB — HEMOGLOBIN A1C: Hemoglobin A1C: 6

## 2014-09-16 ENCOUNTER — Telehealth: Payer: Self-pay | Admitting: *Deleted

## 2014-09-16 NOTE — Telephone Encounter (Signed)
[place] left a msg for a synthroid refill for Arthur Frost. This medication is not on his list and a msg was left for her to return call. Corliss SkainsJamie Tamari Redwine, CMA

## 2014-09-18 ENCOUNTER — Ambulatory Visit (INDEPENDENT_AMBULATORY_CARE_PROVIDER_SITE_OTHER): Payer: Medicare Other | Admitting: Family Medicine

## 2014-09-18 ENCOUNTER — Encounter: Payer: Self-pay | Admitting: Family Medicine

## 2014-09-18 VITALS — BP 128/62 | HR 74 | Wt 249.0 lb

## 2014-09-18 DIAGNOSIS — IMO0001 Reserved for inherently not codable concepts without codable children: Secondary | ICD-10-CM

## 2014-09-18 DIAGNOSIS — H540 Blindness, both eyes: Secondary | ICD-10-CM

## 2014-09-18 DIAGNOSIS — I83025 Varicose veins of left lower extremity with ulcer other part of foot: Secondary | ICD-10-CM

## 2014-09-18 DIAGNOSIS — H547 Unspecified visual loss: Secondary | ICD-10-CM

## 2014-09-18 DIAGNOSIS — S78111A Complete traumatic amputation at level between right hip and knee, initial encounter: Secondary | ICD-10-CM | POA: Insufficient documentation

## 2014-09-18 DIAGNOSIS — I83021 Varicose veins of left lower extremity with ulcer of thigh: Secondary | ICD-10-CM

## 2014-09-18 DIAGNOSIS — Z23 Encounter for immunization: Secondary | ICD-10-CM

## 2014-09-18 DIAGNOSIS — E1142 Type 2 diabetes mellitus with diabetic polyneuropathy: Secondary | ICD-10-CM

## 2014-09-18 DIAGNOSIS — G629 Polyneuropathy, unspecified: Secondary | ICD-10-CM

## 2014-09-18 DIAGNOSIS — I83023 Varicose veins of left lower extremity with ulcer of ankle: Secondary | ICD-10-CM

## 2014-09-18 DIAGNOSIS — I83028 Varicose veins of left lower extremity with ulcer other part of lower leg: Secondary | ICD-10-CM

## 2014-09-18 DIAGNOSIS — I83024 Varicose veins of left lower extremity with ulcer of heel and midfoot: Secondary | ICD-10-CM

## 2014-09-18 DIAGNOSIS — I83029 Varicose veins of left lower extremity with ulcer of unspecified site: Secondary | ICD-10-CM

## 2014-09-18 DIAGNOSIS — E118 Type 2 diabetes mellitus with unspecified complications: Secondary | ICD-10-CM

## 2014-09-18 DIAGNOSIS — Z89611 Acquired absence of right leg above knee: Secondary | ICD-10-CM

## 2014-09-18 DIAGNOSIS — N186 End stage renal disease: Secondary | ICD-10-CM

## 2014-09-18 DIAGNOSIS — I83022 Varicose veins of left lower extremity with ulcer of calf: Secondary | ICD-10-CM

## 2014-09-18 MED ORDER — SERTRALINE HCL 100 MG PO TABS: 100.0000 mg | ORAL_TABLET | Freq: Every day | ORAL | Status: DC

## 2014-09-18 MED ORDER — DOCUSATE SODIUM 100 MG PO CAPS: 100.0000 mg | ORAL_CAPSULE | Freq: Two times a day (BID) | ORAL | Status: DC

## 2014-09-18 MED ORDER — LEVOTHYROXINE SODIUM 100 MCG PO TABS
100.0000 ug | ORAL_TABLET | Freq: Every day | ORAL | Status: DC
Start: 2014-09-18 — End: 2014-09-19

## 2014-09-18 MED ORDER — AMBULATORY NON FORMULARY MEDICATION: Status: AC

## 2014-09-18 MED ORDER — PREGABALIN 100 MG PO CAPS: 100.0000 mg | ORAL_CAPSULE | Freq: Three times a day (TID) | ORAL | Status: DC

## 2014-09-18 MED ORDER — SIMVASTATIN 40 MG PO TABS: 40.0000 mg | ORAL_TABLET | Freq: Every day | ORAL | Status: DC

## 2014-09-18 MED ORDER — CALCIUM ACETATE 667 MG PO CAPS: 1334.0000 mg | ORAL_CAPSULE | Freq: Three times a day (TID) | ORAL | Status: DC

## 2014-09-18 MED ORDER — AZELASTINE HCL 0.1 % NA SOLN: 2.0000 | Freq: Two times a day (BID) | NASAL | Status: DC

## 2014-09-18 MED ORDER — AMBULATORY NON FORMULARY MEDICATION
Status: DC
Start: 2014-09-18 — End: 2014-11-13

## 2014-09-18 NOTE — Patient Instructions (Signed)
Continue Sliding Scale for now. It seems to be working well.

## 2014-09-18 NOTE — Progress Notes (Signed)
   Subjective:    Patient ID: Arthur Frost, male    DOB: 1961/10/05, 53 y.o.   MRN: [id number]  HPI Patient was last seen in our office approximately 2 years ago. Since then he has been in and out of the hospital and nursing homes. He has ESRD kidney disease requiring dialysis and has recurrent decubitus ulcers. He is a diabetic. He is legally blind as well from diabetic retinopathy.   He had his sister who lives with him is requesting a new Nurse, adultHoyer lift. The open they have is not working correctly. They would also like a new electric bed that doesn't require occurring to elevate the head of the bed. And they are also requesting a new manual wheelchair that has an extra depth to it. He tends to lean forward to try to get off of his bottom but it's very narrow so he  Almost falls forward.   his sister pointed out an area on his right great toe that she would like me to look at today. She denies any trauma or known injury. It's not painful but is discolored and looks like it might be started to form an early ulcer. She also reports he has a gallstone lesion on his biox that is being followed by the nurses with [facility].   We called Nephrology.  They do have a pneumococcal 23 on file with them but not Prevnar 13.  Did receive flu vaccine 07/30/14.    Review of Systems     Objective:   Physical Exam  Constitutional: He is oriented to person, place, and time. He appears well-developed and well-nourished.  HENT:  Head: Normocephalic and atraumatic.  Cardiovascular: Normal rate, regular rhythm and normal heart sounds.   Pulmonary/Chest: Effort normal and breath sounds normal.  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  Neurological: He is alert and oriented to person, place, and time.  Skin: Skin is warm and dry.  Psychiatric: He has a normal mood and affect. His behavior is normal.          Assessment & Plan:  Diabetes - controlled. His last A1c in October was 6.0 which is  fantastic. I note he does sister not happy with the current mealtime insulin regimen but it does seem to be working Firefighterfantastically. I strongly encouraged him to stick with it since it seems to be working well and he has not had any hypoglycemic events.  Left great toe ulcer, stage 2.  He has a good dorsal pulses though the distal foot and toe itself looks purple in color. This is not new. We will call him in dialysis and see if they can apply DuoDERM to the end of the toe and change every 3 days. Can keep pressure off the area. They are doing wound care on a pressure ulcer on his buttock area. Again try to keep pressure off of it is much as possible. Next  Right above-the-knee amputation-we will try to get him a new manual wheelchair that has extra depth, electric hospital bed and [facility]. The plan is for him to live with his sister who is going to be his primary caretaker.  End-stage renal disease-getting dialysis 3 times a week [facility].  Diabetic retinopathy-he is legally blind and has been for last several years.  Time spent 40 minutes, greater 50% time spent counseling on diabetes ulcer and coordinating care.

## 2014-09-19 ENCOUNTER — Other Ambulatory Visit: Payer: Self-pay

## 2014-09-19 MED ORDER — SERTRALINE HCL 100 MG PO TABS: 100.0000 mg | ORAL_TABLET | Freq: Every day | ORAL | Status: DC

## 2014-09-19 MED ORDER — SIMVASTATIN 40 MG PO TABS: 40.0000 mg | ORAL_TABLET | Freq: Every day | ORAL | Status: AC

## 2014-09-19 MED ORDER — LEVOTHYROXINE SODIUM 100 MCG PO TABS: 100.0000 ug | ORAL_TABLET | Freq: Every day | ORAL | Status: AC

## 2014-09-19 MED ORDER — CALCIUM ACETATE 667 MG PO CAPS: 1334.0000 mg | ORAL_CAPSULE | Freq: Three times a day (TID) | ORAL | Status: AC

## 2014-09-19 MED ORDER — DOCUSATE SODIUM 100 MG PO CAPS: 100.0000 mg | ORAL_CAPSULE | Freq: Two times a day (BID) | ORAL | Status: AC

## 2014-09-19 MED ORDER — AZELASTINE HCL 0.1 % NA SOLN: 2.0000 | Freq: Two times a day (BID) | NASAL | Status: AC

## 2014-09-27 DIAGNOSIS — L89890 Pressure ulcer of other site, unstageable: Secondary | ICD-10-CM

## 2014-09-27 DIAGNOSIS — I120 Hypertensive chronic kidney disease with stage 5 chronic kidney disease or end stage renal disease: Secondary | ICD-10-CM

## 2014-09-27 DIAGNOSIS — N186 End stage renal disease: Secondary | ICD-10-CM

## 2014-09-30 ENCOUNTER — Other Ambulatory Visit: Payer: Self-pay | Admitting: *Deleted

## 2014-09-30 MED ORDER — AMBULATORY NON FORMULARY MEDICATION: Status: AC

## 2014-09-30 MED ORDER — NORTRIPTYLINE HCL 25 MG PO CAPS: 25.0000 mg | ORAL_CAPSULE | Freq: Two times a day (BID) | ORAL | Status: AC

## 2014-09-30 MED ORDER — PROMETHAZINE HCL 25 MG PO TABS: 25.0000 mg | ORAL_TABLET | Freq: Four times a day (QID) | ORAL | Status: AC | PRN

## 2014-09-30 MED ORDER — PANTOPRAZOLE SODIUM 40 MG PO TBEC: 40.0000 mg | DELAYED_RELEASE_TABLET | Freq: Every day | ORAL | Status: AC

## 2014-10-08 ENCOUNTER — Telehealth: Payer: Self-pay | Admitting: *Deleted

## 2014-10-08 NOTE — Telephone Encounter (Signed)
Jane with [facility] called and asked for VO for cont of PT/OT for 4 additional wks. VO given.Loralee PacasBarkley, Carolie Mcilrath [place]

## 2014-10-17 ENCOUNTER — Other Ambulatory Visit: Payer: Self-pay

## 2014-10-17 MED ORDER — GLUCOSE BLOOD VI STRP: ORAL_STRIP | Status: AC

## 2014-10-17 MED ORDER — INSULIN DETEMIR 100 UNIT/ML ~~LOC~~ SOLN: 20.0000 [IU] | Freq: Every day | SUBCUTANEOUS | Status: DC

## 2014-10-17 MED ORDER — INSULIN LISPRO 100 UNIT/ML ~~LOC~~ SOLN: 1.0000 [IU] | Freq: Four times a day (QID) | SUBCUTANEOUS | Status: DC | PRN

## 2014-10-28 ENCOUNTER — Telehealth: Payer: Self-pay | Admitting: *Deleted

## 2014-10-28 NOTE — Telephone Encounter (Signed)
Arthur Frost with [facility] called and wants an order for a Social worker to evaluate patient needs. Gave verbal order.   Arthur Frost [phone]

## 2014-10-29 NOTE — Telephone Encounter (Signed)
Agree with below. Addendum:    I believe that she has limits with moving and including, toileting, bathing, feeding, dressing and grooming. I believe the power wheelchair is needed for pt to be able to perform ADL's in her home

## 2014-11-04 ENCOUNTER — Telehealth: Payer: Self-pay | Admitting: *Deleted

## 2014-11-04 DIAGNOSIS — Z89611 Acquired absence of right leg above knee: Secondary | ICD-10-CM

## 2014-11-04 DIAGNOSIS — Z89612 Acquired absence of left leg above knee: Secondary | ICD-10-CM

## 2014-11-04 DIAGNOSIS — R29898 Other symptoms and signs involving the musculoskeletal system: Secondary | ICD-10-CM

## 2014-11-04 NOTE — Telephone Encounter (Signed)
Called about a manual wheel chair order and cushion for his wheelchair. Gel or roho cushion  [phone]

## 2014-11-07 MED ORDER — AMBULATORY NON FORMULARY MEDICATION: Status: AC

## 2014-11-11 ENCOUNTER — Telehealth: Payer: Self-pay | Admitting: *Deleted

## 2014-11-11 NOTE — Telephone Encounter (Signed)
Gave verbal for continued PT/OT x 4wks. She informed me that pt was sent to [facility] in [place].Loralee PacasBarkley, Elyse Prevo IoniaLynetta

## 2014-11-13 ENCOUNTER — Other Ambulatory Visit: Payer: Self-pay | Admitting: *Deleted

## 2014-11-13 MED ORDER — AMBULATORY NON FORMULARY MEDICATION: Status: AC

## 2014-11-22 ENCOUNTER — Ambulatory Visit (INDEPENDENT_AMBULATORY_CARE_PROVIDER_SITE_OTHER): Payer: Medicare Other | Admitting: Family Medicine

## 2014-11-22 ENCOUNTER — Encounter: Payer: Self-pay | Admitting: Family Medicine

## 2014-11-22 VITALS — BP 96/69 | HR 85 | Wt 248.0 lb

## 2014-11-22 DIAGNOSIS — R05 Cough: Secondary | ICD-10-CM

## 2014-11-22 DIAGNOSIS — G4700 Insomnia, unspecified: Secondary | ICD-10-CM

## 2014-11-22 DIAGNOSIS — T17308A Unspecified foreign body in larynx causing other injury, initial encounter: Secondary | ICD-10-CM

## 2014-11-22 DIAGNOSIS — N186 End stage renal disease: Secondary | ICD-10-CM

## 2014-11-22 DIAGNOSIS — R21 Rash and other nonspecific skin eruption: Secondary | ICD-10-CM

## 2014-11-22 DIAGNOSIS — R058 Other specified cough: Secondary | ICD-10-CM

## 2014-11-22 DIAGNOSIS — H9313 Tinnitus, bilateral: Secondary | ICD-10-CM

## 2014-11-22 DIAGNOSIS — E118 Type 2 diabetes mellitus with unspecified complications: Secondary | ICD-10-CM

## 2014-11-22 LAB — POCT GLYCOSYLATED HEMOGLOBIN (HGB A1C): HEMOGLOBIN A1C: 7.6

## 2014-11-22 MED ORDER — PREGABALIN 100 MG PO CAPS: 100.0000 mg | ORAL_CAPSULE | Freq: Three times a day (TID) | ORAL | Status: DC

## 2014-11-22 MED ORDER — AMBULATORY NON FORMULARY MEDICATION
Status: AC
Start: 2014-11-22 — End: ?

## 2014-11-22 MED ORDER — ACYCLOVIR 400 MG PO TABS: 400.0000 mg | ORAL_TABLET | Freq: Three times a day (TID) | ORAL | Status: AC

## 2014-11-22 NOTE — Progress Notes (Signed)
   Subjective:    Patient ID: Arthur Frost, male    DOB: [birth year], 54 y.o.   MRN: [id number]  HPI Diabetes - no hypoglycemic events. No wounds or sores that are not healing well. No increased thirst or urination. Checking glucose at home. Taking medications as prescribed without any side effects. Morning sugar are good and then by lunch it is highger.   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  He has a temporary tube  Needs a rx for a fitted seat.  Needs order for wheelchair.  [facility] in [place], [place].   Rx for adult diapers.    Was in the nursing home for the Ambien and the narcotics.  He was there for 2 years.     Dry cough on and off. Using robitussion and mucinex.  Has to sit all the way up when he is eating.  Will choke when eating easily.  Denies any dysphagia.  No voice changes.   Gets occ rash on his bottom between rectum and scrotum. caregiver says it looks like blisters and they they crust over.  Wonder if is herpes. No hx of cold sores.  Has been sexually active in years.   Bilat ear ringing.  Would like to see ENT.    Review of Systems     Objective:   Physical Exam  Constitutional: He is oriented to person, place, and time. He appears well-developed and well-nourished.  HENT:  Head: Normocephalic and atraumatic.  Right Ear: External ear normal.  Left Ear: External ear normal.  Nose: Nose normal.  Mouth/Throat: Oropharynx is clear and moist.  TMs and canals are clear.   Eyes: Conjunctivae and EOM are normal. Pupils are equal, round, and reactive to light.  Neck: Neck supple. No thyromegaly present.  Cardiovascular: Normal rate and normal heart sounds.   Pulmonary/Chest: Effort normal and breath sounds normal.  Lymphadenopathy:    He has no cervical adenopathy.  Neurological: He is alert and oriented to person, place, and time.  Skin: Skin is warm and dry.  Psychiatric: He has a normal mood and affect.          Assessment & Plan:  DM-  uncontrolled. Jump in diabetes.  Increase Levemir 22 units. May need to adjust SSI as well.    ESRD- failed fistula. Follows with Nerphology  Insomnia  - will need to get records from nursing home as far as AMbien and pain medsa re concerned  Tinnitus - ear exam normal. Will refer to ENT for further evaluation.   Incontinene - rx for adult diapers given.   Dry cough/choking - Rec swallow study, or can have ENT address as aell as ear ringing.   Rash- no exam performed as no active rash.  Suspect herpes. Ok with trial of antiviral to try. Would perfer to collect specimen if able to collect. Can come in with active rash if able.

## 2014-12-06 ENCOUNTER — Telehealth: Payer: Self-pay | Admitting: *Deleted

## 2014-12-06 ENCOUNTER — Other Ambulatory Visit: Payer: Self-pay | Admitting: Family Medicine

## 2014-12-06 MED ORDER — ZOLPIDEM TARTRATE 5 MG PO TABS: 5.0000 mg | ORAL_TABLET | Freq: Every evening | ORAL | Status: AC | PRN

## 2014-12-06 MED ORDER — OXYCODONE-ACETAMINOPHEN 10-325 MG PO TABS: 1.0000 | ORAL_TABLET | Freq: Two times a day (BID) | ORAL | Status: AC | PRN

## 2014-12-06 NOTE — Telephone Encounter (Signed)
Called and requested that pt's medication records be faxed to us.Loralee PacasBarkKorealey, Arpan Eskelson [place]Lynetta

## 2014-12-09 ENCOUNTER — Telehealth: Payer: Self-pay

## 2014-12-09 NOTE — Telephone Encounter (Signed)
Arthur Frost with [facility] called and reports Mr Arthur Frost's blood pressure is elevated - 180/108. 911 has been called for transport to ED. She states he no longer has a caregiver. His sister left a few weeks ago, his brother left this morning and his girlfriend left last week. They will get a Child psychotherapistsocial worker involved for placement in skilled nursing.

## 2015-01-08 ENCOUNTER — Other Ambulatory Visit: Payer: Self-pay | Admitting: Family Medicine

## 2015-01-08 MED ORDER — INSULIN LISPRO 100 UNIT/ML ~~LOC~~ SOLN: 1.0000 [IU] | Freq: Four times a day (QID) | SUBCUTANEOUS | Status: AC | PRN

## 2015-01-08 NOTE — Telephone Encounter (Signed)
[id number].

## 2015-01-09 ENCOUNTER — Other Ambulatory Visit: Payer: Self-pay | Admitting: Family Medicine

## 2015-02-09 ENCOUNTER — Other Ambulatory Visit: Payer: Self-pay | Admitting: Family Medicine

## 2015-02-11 ENCOUNTER — Other Ambulatory Visit: Payer: Self-pay | Admitting: *Deleted

## 2015-02-24 ENCOUNTER — Ambulatory Visit: Payer: Medicare Other | Admitting: Family Medicine

## 2015-08-29 ENCOUNTER — Other Ambulatory Visit: Payer: Self-pay

## 2015-08-29 MED ORDER — SERTRALINE HCL 100 MG PO TABS: 100.0000 mg | ORAL_TABLET | Freq: Every day | ORAL | Status: AC

## 2015-09-28 ENCOUNTER — Other Ambulatory Visit: Payer: Self-pay | Admitting: Family Medicine

## 2021-07-18 DEATH — deceased
